# Patient Record
Sex: Female | Born: 1985 | Race: White | Hispanic: Yes | Smoking: Never smoker
Health system: Southern US, Community
[De-identification: ages and names within clinical notes are randomized; demographics above are authoritative.]

## PROBLEM LIST (undated history)

## (undated) DIAGNOSIS — R109 Unspecified abdominal pain: Secondary | ICD-10-CM

## (undated) DIAGNOSIS — Z789 Other specified health status: Secondary | ICD-10-CM

## (undated) DIAGNOSIS — O139 Gestational [pregnancy-induced] hypertension without significant proteinuria, unspecified trimester: Secondary | ICD-10-CM

## (undated) DIAGNOSIS — O1490 Unspecified pre-eclampsia, unspecified trimester: Secondary | ICD-10-CM

## (undated) HISTORY — DX: Gestational (pregnancy-induced) hypertension without significant proteinuria, unspecified trimester: O13.9

## (undated) HISTORY — DX: Other specified health status: Z78.9

## (undated) HISTORY — PX: OTHER SURGICAL HISTORY: SHX169

## (undated) HISTORY — PX: NO PAST SURGERIES: SHX2092

## (undated) HISTORY — DX: Unspecified pre-eclampsia, unspecified trimester: O14.90

---

## 2005-06-17 DIAGNOSIS — O1490 Unspecified pre-eclampsia, unspecified trimester: Secondary | ICD-10-CM

## 2005-06-17 HISTORY — DX: Unspecified pre-eclampsia, unspecified trimester: O14.90

## 2012-01-27 ENCOUNTER — Encounter: Payer: Self-pay | Admitting: Obstetrics & Gynecology

## 2012-01-27 ENCOUNTER — Ambulatory Visit (INDEPENDENT_AMBULATORY_CARE_PROVIDER_SITE_OTHER): Payer: Self-pay

## 2012-01-27 VITALS — BP 142/92 | Temp 97.8°F | Ht <= 58 in | Wt 151.5 lb

## 2012-01-27 DIAGNOSIS — Z3490 Encounter for supervision of normal pregnancy, unspecified, unspecified trimester: Secondary | ICD-10-CM

## 2012-01-27 DIAGNOSIS — Z3201 Encounter for pregnancy test, result positive: Secondary | ICD-10-CM

## 2012-01-27 DIAGNOSIS — O0990 Supervision of high risk pregnancy, unspecified, unspecified trimester: Secondary | ICD-10-CM

## 2012-01-27 DIAGNOSIS — O09299 Supervision of pregnancy with other poor reproductive or obstetric history, unspecified trimester: Secondary | ICD-10-CM

## 2012-01-27 DIAGNOSIS — R030 Elevated blood-pressure reading, without diagnosis of hypertension: Secondary | ICD-10-CM

## 2012-01-27 DIAGNOSIS — O09899 Supervision of other high risk pregnancies, unspecified trimester: Secondary | ICD-10-CM

## 2012-01-27 LAB — COMPREHENSIVE METABOLIC PANEL
Albumin: 4.1 g/dL (ref 3.5–5.2)
Alkaline Phosphatase: 72 U/L (ref 39–117)
BUN: 7 mg/dL (ref 6–23)
Calcium: 9.1 mg/dL (ref 8.4–10.5)
Chloride: 102 mEq/L (ref 96–112)
Glucose, Bld: 132 mg/dL — ABNORMAL HIGH (ref 70–99)
Potassium: 3.9 mEq/L (ref 3.5–5.3)

## 2012-01-27 LAB — POCT URINALYSIS DIP (DEVICE)
Bilirubin Urine: NEGATIVE
Glucose, UA: NEGATIVE mg/dL
Hgb urine dipstick: NEGATIVE
Leukocytes, UA: NEGATIVE
Nitrite: NEGATIVE
Urobilinogen, UA: 0.2 mg/dL (ref 0.0–1.0)

## 2012-01-27 LAB — POCT PREGNANCY, URINE: Preg Test, Ur: POSITIVE — AB

## 2012-01-27 LAB — HIV ANTIBODY (ROUTINE TESTING W REFLEX): HIV: NONREACTIVE

## 2012-01-27 NOTE — Progress Notes (Signed)
Pulse- 72  Pain/pressure- lower abd "ligament pain" Education booklet given.  Currently has WIC with previous child.

## 2012-01-28 ENCOUNTER — Ambulatory Visit (HOSPITAL_COMMUNITY)
Admission: RE | Admit: 2012-01-28 | Discharge: 2012-01-28 | Disposition: A | Discharge status: Home / Self Care | Payer: Self-pay | Source: Ambulatory Visit | Attending: Obstetrics & Gynecology | Admitting: Obstetrics & Gynecology

## 2012-01-28 DIAGNOSIS — O3680X0 Pregnancy with inconclusive fetal viability, not applicable or unspecified: Secondary | ICD-10-CM | POA: Insufficient documentation

## 2012-01-28 DIAGNOSIS — Z3490 Encounter for supervision of normal pregnancy, unspecified, unspecified trimester: Secondary | ICD-10-CM

## 2012-01-28 DIAGNOSIS — O09899 Supervision of other high risk pregnancies, unspecified trimester: Secondary | ICD-10-CM

## 2012-01-28 DIAGNOSIS — O09299 Supervision of pregnancy with other poor reproductive or obstetric history, unspecified trimester: Secondary | ICD-10-CM | POA: Insufficient documentation

## 2012-01-28 DIAGNOSIS — O34219 Maternal care for unspecified type scar from previous cesarean delivery: Secondary | ICD-10-CM | POA: Insufficient documentation

## 2012-01-28 LAB — OBSTETRIC PANEL
Basophils Absolute: 0 10*3/uL (ref 0.0–0.1)
Eosinophils Relative: 1 % (ref 0–5)
Hepatitis B Surface Ag: NEGATIVE
Lymphocytes Relative: 24 % (ref 12–46)
Neutro Abs: 6.4 10*3/uL (ref 1.7–7.7)
Neutrophils Relative %: 71 % (ref 43–77)
Platelets: 355 10*3/uL (ref 150–400)
RBC: 4.54 MIL/uL (ref 3.87–5.11)
RDW: 13.2 % (ref 11.5–15.5)
Rubella: 151.7 IU/mL
WBC: 9.1 10*3/uL (ref 4.0–10.5)

## 2012-01-29 LAB — CULTURE, OB URINE

## 2012-01-29 LAB — CREATININE CLEARANCE, URINE, 24 HOUR
Creatinine, 24H Ur: 255 mg/d — ABNORMAL LOW (ref 700–1800)
Creatinine, Urine: 19.6 mg/dL
Creatinine: 0.52 mg/dL (ref 0.50–1.10)

## 2012-01-29 LAB — HEMOGLOBINOPATHY EVALUATION
Hgb A: 97.4 % (ref 96.8–97.8)
Hgb F Quant: 0 % (ref 0.0–2.0)

## 2012-01-29 LAB — PROTEIN, URINE, 24 HOUR

## 2012-01-30 ENCOUNTER — Ambulatory Visit (INDEPENDENT_AMBULATORY_CARE_PROVIDER_SITE_OTHER): Payer: Self-pay | Admitting: Obstetrics and Gynecology

## 2012-01-30 ENCOUNTER — Other Ambulatory Visit: Payer: Self-pay

## 2012-01-30 ENCOUNTER — Encounter: Payer: Self-pay | Admitting: Obstetrics and Gynecology

## 2012-01-30 VITALS — Ht 58.75 in | Wt 152.3 lb

## 2012-01-30 VITALS — BP 118/76 | Temp 96.4°F | Wt 152.3 lb

## 2012-01-30 DIAGNOSIS — O34219 Maternal care for unspecified type scar from previous cesarean delivery: Secondary | ICD-10-CM

## 2012-01-30 DIAGNOSIS — Z3480 Encounter for supervision of other normal pregnancy, unspecified trimester: Secondary | ICD-10-CM

## 2012-01-30 DIAGNOSIS — Z98891 History of uterine scar from previous surgery: Secondary | ICD-10-CM | POA: Insufficient documentation

## 2012-01-30 DIAGNOSIS — O09299 Supervision of pregnancy with other poor reproductive or obstetric history, unspecified trimester: Secondary | ICD-10-CM | POA: Insufficient documentation

## 2012-01-30 DIAGNOSIS — Z3490 Encounter for supervision of normal pregnancy, unspecified, unspecified trimester: Secondary | ICD-10-CM

## 2012-01-30 LAB — POCT URINALYSIS DIP (DEVICE)
Bilirubin Urine: NEGATIVE
Glucose, UA: NEGATIVE mg/dL
Hgb urine dipstick: NEGATIVE
Ketones, ur: NEGATIVE mg/dL
Specific Gravity, Urine: 1.015 (ref 1.005–1.030)
pH: 6.5 (ref 5.0–8.0)

## 2012-01-30 MED ORDER — PRENATAL VITAMINS 0.8 MG PO TABS: 1.0000 | ORAL_TABLET | Freq: Every day | ORAL | Status: DC

## 2012-01-30 NOTE — Progress Notes (Signed)
   Subjective:    Penny Spencer is a [id number] [redacted]w[redacted]d being seen today for her first obstetrical visit.  Her obstetrical history is significant for pre-eclampsia and two previous cesarean sections. Patient reports preeclampsia with first pregnancy at term and was delivered by cesarean-section. Patient with uncomplicated second pregnancy and had an elective repeat cesarean section. Patient does intend to breast feed. Pregnancy history fully reviewed.  Patient reports lower abdominal cramping pains on occasions.  Filed Vitals:   01/30/12 1115  BP: 118/76  Temp: 96.4 F (35.8 C)  Weight: 152 lb 4.8 oz (69.083 kg)    HISTORY: OB History    Grav Para Term Preterm Abortions TAB SAB Ect Mult Living   4 2 2  1  1   2      # Outc Date GA Lbr Len/2nd Wgt Sex Del Anes PTL Lv   1 TRM 2/07 [redacted]w[redacted]d  6lb9.8oz(3kg) F LTCS Spinal  Yes   Comments: preeclampsia- emer   2 SAB 5/11           3 TRM 5/12 [redacted]w[redacted]d  8lb(3.629kg) F CS Spinal  Yes   4 CUR              Past Medical History  Diagnosis Date  . Pregnancy induced hypertension   . Preeclampsia 2007  . No pertinent past medical history    Past Surgical History  Procedure Date  . Cesarean section   . No past surgeries    Family History  Problem Relation Age of Onset  . Diabetes Father      Exam    Uterus:     Pelvic Exam:    Perineum: No Hemorrhoids, Normal Perineum   Vulva: normal   Vagina:  normal mucosa, normal discharge   pH:    Cervix: close and long   Adnexa: normal adnexa and no mass, fullness, tenderness   Bony Pelvis: android  System: Breast:  normal appearance, no masses or tenderness   Skin: normal coloration and turgor, no rashes    Neurologic: grossly non-focal   Extremities: normal strength, tone, and muscle mass   HEENT extra ocular movement intact   Mouth/Teeth mucous membranes moist, pharynx normal without lesions and dental hygiene good   Neck supple and no masses   Cardiovascular: regular rate and rhythm     Respiratory:  chest clear, no wheezing, crepitations, rhonchi, normal symmetric air entry   Abdomen: soft, non-tender; bowel sounds normal; no masses,  no organomegaly   Urinary:       Assessment:    Pregnancy: [id number] Patient Active Problem List  Diagnosis  . Previous cesarean section  . Hx of preeclampsia, prior pregnancy, currently pregnant  . Supervision of other normal pregnancy        Plan:     Initial labs drawn. Prenatal vitamins. Problem list reviewed and updated. Pap smears today Genetic Screening discussed First Screen: declined. Patient also declined quad screen  Ultrasound discussed; fetal survey: requested. Will be ordered at next visit  Follow up in 4 weeks. 30 min visit spent on counseling and coordination of care.     Daquon Greenleaf 01/30/2012

## 2012-01-30 NOTE — Progress Notes (Signed)
Nutrition Note: (1st visit consult) Pt has hx of obesity. Pt has gained 0.3# @[redacted]w[redacted]d , which is slightly < expected.  Pt reports eating 2 meals currently due to N/V. Pt reports only drinking water currently & is not taking PNV yet. Disc wt gain goals of 11-20#. Pt given verbal & written education on tips to decrease N/V & energy dense snacks to help with wt gain. Pt agrees to try to eat more often (suggested 3 meals & 1-2 snacks) & start taking PNV as prescribed by doctor. Pt does receive WIC services. Pt plans to BF. F/u if referred. Blondell Reveal, MS, RD, LDN

## 2012-01-30 NOTE — Progress Notes (Signed)
P= 67 Pressure in lower abdomen especially in supine position

## 2012-01-31 ENCOUNTER — Encounter: Payer: Self-pay | Admitting: Obstetrics and Gynecology

## 2012-02-01 LAB — CULTURE, OB URINE

## 2012-02-03 ENCOUNTER — Encounter: Payer: Self-pay | Admitting: Family Medicine

## 2012-02-26 ENCOUNTER — Ambulatory Visit (INDEPENDENT_AMBULATORY_CARE_PROVIDER_SITE_OTHER): Payer: Self-pay | Admitting: Family Medicine

## 2012-02-26 VITALS — BP 128/80 | Temp 97.0°F | Wt 155.3 lb

## 2012-02-26 DIAGNOSIS — O9989 Other specified diseases and conditions complicating pregnancy, childbirth and the puerperium: Secondary | ICD-10-CM

## 2012-02-26 DIAGNOSIS — O34219 Maternal care for unspecified type scar from previous cesarean delivery: Secondary | ICD-10-CM

## 2012-02-26 DIAGNOSIS — O09299 Supervision of pregnancy with other poor reproductive or obstetric history, unspecified trimester: Secondary | ICD-10-CM

## 2012-02-26 DIAGNOSIS — R51 Headache: Secondary | ICD-10-CM

## 2012-02-26 DIAGNOSIS — O99619 Diseases of the digestive system complicating pregnancy, unspecified trimester: Secondary | ICD-10-CM

## 2012-02-26 DIAGNOSIS — O26899 Other specified pregnancy related conditions, unspecified trimester: Secondary | ICD-10-CM

## 2012-02-26 DIAGNOSIS — K219 Gastro-esophageal reflux disease without esophagitis: Secondary | ICD-10-CM

## 2012-02-26 LAB — POCT URINALYSIS DIP (DEVICE)
Bilirubin Urine: NEGATIVE
Glucose, UA: NEGATIVE mg/dL
Ketones, ur: NEGATIVE mg/dL
Leukocytes, UA: NEGATIVE

## 2012-02-26 MED ORDER — RANITIDINE HCL 150 MG PO TABS: 150.0000 mg | ORAL_TABLET | Freq: Every day | ORAL | Status: DC

## 2012-02-26 NOTE — Progress Notes (Signed)
Pulse 78 Patient reports some abdominal pain. Also states that she feels the PNV are causing her to be very nauseous and also reports headaches and dizziness and a general feeling of "just feeling bad all time, like the flu"

## 2012-02-26 NOTE — Progress Notes (Signed)
Pt with occasional nausea/vomiting with prenatal vitamins, occasional reflux symptoms. Headaches/dizziness now and then. Symptoms new since around the time she found out she was pregnant. Advised Tylenol and Zantac, Flintstones vitamins if continues to tolerate PNV poorly. Follow pressures (142 systolic at 10w, 161 today, history of pre-E with first pregnancy).

## 2012-02-26 NOTE — Patient Instructions (Addendum)
Puede tomar Tylenol hasta 4 gramas (4000 mg) diario para dolor de cabeza.   Dolor de cabeza por tensin (Cefalea por contraccin muscular) (Tension Headache [Muscle Contraction Headache]) Le han diagnosticado una cefalea tensional. Se trata de una de las causas ms frecuentes de dolor de [place]. Generalmente estas cefaleas se sienten como un dolor en la parte superior de la cabeza y en la parte posterior del cuello. El estrs, la ansiedad y la depresin son disparadores frecuentes de Photographer. Las cefaleas por tensin no ponen en peligro la vida y no conducirn a otros tipos de cefalea. Este trastorno puede diagnosticarse a Glass blower/designer de sus antecedentes y de un examen fsico. Algunas veces es necesario realizar anlisis de laboratorio y radiografas para Pharmacist, hospital diagnstico (determinar cul es el problema). El profesional que lo asiste podr ayudarlo a que usted [employer] problemas personales que le ocasionan el estrs o la ansiedad. Podrn prescribirle antidepresivos si el problema es la depresin. INSTRUCCIONES PARA EL CUIDADO DOMICILIARIO  Si le realizan pruebas, comunquese para [employer]. Recuerde: es responsabilidad suya retirar los [employer] de [address]. No piense que est todo bien porque el profesional que lo asiste no lo llama.   Utilice los medicamentos de venta libre o de prescripcin para Chief Technology Officer, Environmental health practitioner o la [place], segn se lo indique el profesional que lo asiste.   Las tcnicas de biorretroalimentacin, los masajes y otras tcnicas de Microbiologist pueden ser de [place].   Pueden utilizarse bolsas de hielo o calor en la cabeza o en el cuello. Repita tres o cuatro veces por da, o cuando lo necesite.   Adems del [place], puede ser de utilidad la fisioterapia.   Si las cefaleas continan an con Scientist, research (medical), puede ser necesario que comience a pensar en realizar algunos cambios en su estilo de vida.  SOLICITE ATENCIN MDICA SI:  Presenta  algn problema con el medicamento que le han prescrito.   No responde o no obtiene S[employer].   El dolor de cabeza que senta habitualmente es diferente.   Si tiene nuseas (ganas de vomitar) o vmitos.  SOLICITE ATENCIN MDICA DE INMEDIATO SI:   El dolor de cabeza es cada vez ms intenso.   La temperatura oral se eleva sin motivo por encima de 102 F (38.9 C) o la que el profesional que lo asiste le indique.   Presenta rigidez en el cuello.   Sufre prdida de la visin.   Siente debilidad muscular.   Sufre prdida del control muscular.   Desarrolla sntomas graves diferentes de los primeros.   Comienza a perder el equilibrio o tiene problemas para caminar.   Se marea o pierde el conocimiento.  EST SEGURO QUE:   Comprende las instrucciones para el alta mdica.   Controlar su enfermedad.   Solicitar atencin mdica de inmediato segn las indicaciones.  Document Released: 03/13/2005 Document Revised: 05/23/2011 [facility] Patient Information 2012 [place], [place].  Acidez de estmago durante el embarazo  (Heartburn During Pregnancy)  La acidez es la sensacin de ardor en el pecho que se siente cuando el cido del estmago vuelve haca el esfago. La acidez (tambin llamada "reflujo") es frecuente en el embarazo debido a ciertos cambios hormonales (progesterona). La progesterona relaja la vlvula que separa el esfago del [place]. Esto hace que el cido suba al esfago y cause acidez. Tambin puede ocurrir Visual merchandiser debido a que el tero al agrandarse empuja el estmago, lo que hace que suba ms cido al  esfago. Esto se produce especialmente en las ltimas etapas del embarazo. La acidez generalmente desaparece despus del parto.  CAUSAS  La hormona progesterona.   Cambios en los niveles hormonales.   El tero crece y [address] cido del estmago [place].   Comidas abundantes.   Ciertos alimentos y bebidas.   La prctica de ejercicios.    Aumento en la produccin de cido.  SNTOMAS  Dolor intenso en el pecho o la parte baja de la garganta.   Gusto amargo en la boca.   Tos.  DIAGNSTICO El mdico la diagnostica con una historia clnica cuidadosa en la que pregunta por sus molestias. Le indicar un anlisis de sangre para Clinical research associate cierto tipo de bacteria que se asocia con la [place]. En algunos casos se diagnostica recetando un medicamento para calmar la acidez y viendo si los sntomas mejoran. Durante el embarazo no es frecuente que se indique una endoscopa. En este procedimiento se [place] un tubo con Neomia Dear luz y una cmara en un extremo, y se examina el esfago y Investment banker, corporate.  TRATAMIENTO  El mdico aconsejar sobre el uso de medicamentos de venta libre (anticidos, medicamentos para disminuir la Engineering geologist) en los casos de sntomas leves.   El mdico indicar medicamentos para disminuir el cido estomacal o para proteger la superficie del [place].   El profesional indicar cambios en la dieta.   En casos graves, el mdico recomendar que eleve la cabecera de la cama con bloques. (Dormir con ms almohadas no es Manufacturing systems engineer, ya que slo modifica la posicin de la cabeza y no mejora el problema principal del reflujo cido del estmago al esfago.)  INSTRUCCIONES PARA EL CUIDADO DOMICILIARIO  Tome todos los medicamentos segn le indic su mdico.   Levante la cabecera de la cama con bloques bajo las patas.   No haga ejercicios enseguida despus de comer.   Evite comer 2  3 horas antes de ir a dormir.  No se acueste enseguida despus de comer.   Haga comidas pequeas durante Glass blower/designer de 3 comidas abundantes.   Identifique los alimentos o las bebidas que empeoran sus sntomas y evtelos. Los alimentos que debe evitar son:   Pimientos.   Chocolate.   Alimentos alto en grasas, incluidos los fritos.   Comidas muy condimentadas.   Ajo, cebolla.   Frutos ctricos, que incluyen naranjas, uvas, limones  y limas.   Alimentos que contengan tomates o productos derivados del [place].   Menta.   Bebidas gaseosas y con cafena.   Vinagre.  SOLICITE ATENCIN MDICA DE INMEDIATO SI:  Tiene dolor intenso en el pecho que baja hacia el brazo o hacia la mandbula o cuello.   Se siente sudoroso, mareado o sufre un desmayo.   Falta de aire.   Vomita sangre.   Dolor o dificultad para tragar.   Materia fecal con sangre o de color negro.   Tiene acidez ms de 3 veces por semana durante ms de 2 semanas.  ASEGRESE DE QUE:   Comprende estas instrucciones.   Controlar su enfermedad.   Solicitar ayuda de inmediato si no mejora o si empeora.  Document Released: 03/13/2005 Document Revised: 05/23/2011 [facility] Patient Information 2012 [place], [place].  Embarazo - Segundo trimestre (Pregnancy - Second Trimester) El segundo trimestre del Psychiatrist (del 3 al ) es un perodo de evolucin rpida para usted y el beb. Hacia el final del sexto mes, el beb mide aproximadamente 23 cm y pesa 680 g. Comenzar a sentir  los movimientos del beb entre las 18 y las 20 [address] de [place]. Podr sentir las pataditas ("quickening en ingls"). Hay un rpido [employer]. Puede segregar un lquido claro Charity fundraiser) de las [place]. Quizs sienta pequeas contracciones en el vientre (tero) Esto se conoce como falso trabajo de parto o contracciones de [place]. Es como una prctica del trabajo de parto que se produce cuando el beb est listo para salir. Generalmente los problemas de vmitos matinales ya se han superado hacia el final del Medical sales representative. Algunas mujeres desarrollan pequeas manchas oscuras (que se denominan cloasma, mscara del embarazo) en la cara que normalmente se van luego del nacimiento del beb. La exposicin al sol empeora las manchas. Puede desarrollarse acn en algunas mujeres embarazadas, y puede desaparecer en aquellas que ya tienen acn. EXAMENES PRENATALES  Durante los  [employer], deber seguir realizando pruebas de [place], segn avance el [place]. Estas pruebas se realizan para controlar su salud y la del beb. Tambin se realizan anlisis de sangre para [employer] niveles de [place]. La anemia (bajo nivel de hemoglobina) es frecuente durante el embarazo. Para prevenirla, se administran hierro y vitaminas. Tambin se le realizarn exmenes para saber si tiene diabetes entre las 24 y las 28 semanas del [place]. Podrn repetirle algunas de las [employer] Enterprises hicieron previamente.   En cada visita le medirn el tamao del tero. Esto se realiza para asegurarse de que el beb est creciendo correctamente de acuerdo al estado del [place].   Tambin en cada visita prenatal controlarn su presin arterial. Esto se realiza para asegurarse de que no tenga toxemia.   Se controlar su orina para asegurarse de que no tenga infecciones, diabetes o protena en la orina.   Se controlar su peso regularmente para asegurarse que el aumento ocurre al ritmo indicado. Esto se hace para asegurarse que usted y el beb tienen una evolucin normal.   En algunas ocasiones se realiza una prueba de ultrasonido para confirmar el correcto desarrollo y evolucin del beb. Esta prueba se realiza con ondas sonoras inofensivas para el beb, de modo que el profesional pueda calcular ms precisamente la fecha del [place].  Algunas veces se realizan pruebas especializadas del lquido amnitico que rodea al beb. Esta prueba se denomina amniocentesis. El lquido amnitico se obtiene introduciendo una aguja en el vientre (abdomen). Se realiza para Conservator, museum/gallery en los que existe alguna preocupacin acerca de algn problema gentico que pueda sufrir el beb. En ocasiones se lleva a cabo cerca del final del embarazo, si es necesario inducir al Apple Computer. En este caso se realiza para asegurarse que los pulmones del beb estn lo suficientemente maduros como para que  pueda vivir fuera del tero. CAMBIOS QUE OCURREN EN EL SEGUNDO TRIMESTRE DEL EMBARAZO Su organismo atravesar numerosos cambios durante el [employer]. Estos pueden variar de Neomia Dear persona a otra. [place]verse con el profesional que la asiste acerca los cambios que usted note y que la preocupen.  Durante el segundo trimestre probablemente sienta un aumento del apetito. Es normal tener "antojos" de Development worker, community. Esto vara de Neomia Dear persona a otra y de un embarazo a Therapist, art.   El abdomen inferior comenzar a abultarse.   Podr tener la necesidad de Geographical information systems officer con ms frecuencia debido a que el tero y el beb presionan sobre la vejiga. Tambin es frecuente contraer ms infecciones urinarias durante el embarazo (dolor al ConocoPhillips). Puede evitarlas bebiendo gran cantidad de lquidos y vaciando la vejiga antes y despus de Pharmacologist  relaciones sexuales.   Podrn aparecer las primeras estras en las caderas, abdomen y [place]. Estos son cambios normales del cuerpo durante el [place]. No existen medicamentos ni ejercicios que puedan prevenir [employer].   Es posible que comience a desarrollar venas inflamadas y abultadas (varices) en las piernas. El uso de medias de descanso, Optometrist sus pies durante 15 minutos, 3 a 4 veces al da y Film/video editor la sal en su dieta ayuda a Journalist, newspaper.   Podr sentir Engineering geologist gstrica a medida que el tero crece y Doctor, general practice. Puede tomar anticidos, con la autorizacin de su mdico, para Financial planner. Tambin es til ingerir pequeas comidas 4 a 5 veces al Futures trader.   La constipacin puede tratarse con un laxante o agregando fibra a su dieta. Beber grandes cantidades de lquidos, comer vegetales, frutas y granos integrales es de [place].   Tambin es beneficioso practicar actividad fsica. Si ha sido una persona Engineer, mining, podr continuar con la Harley[employer]son de las actividades durante el mismo. Si ha sido [employer], puede ser beneficioso que comience  con un programa de ejercicios, Museum/gallery exhibitions officer.   Puede desarrollar hemorroides (vrices en el recto) hacia el final del segundo trimestre. Tomar baos de asiento tibios y Chemical engineer cremas recomendadas por el profesional que lo asiste sern de ayuda para los problemas de hemorroides.   Tambin podr Financial risk analyst de espalda durante este momento de su embarazo. Evite levantar objetos pesados, utilice zapatos de taco bajo y [place] buena postura para ayudar a reducir los problemas de [place].   Algunas mujeres embarazadas desarrollan hormigueo y adormecimiento de la mano y los dedos debido a la hinchazn y compresin de los ligamentos de la mueca (sndrome del tnel carpiano). Esto desaparece una vez que el beb nace.   Como sus pechos se agrandan, Pension scheme manager un sujetador ms grande. Use un sostn de soporte, cmodo y de algodn. No utilice un sostn para amamantar hasta el ltimo mes de embarazo si va a amamantar al beb.   Podr observar una lnea oscura desde el ombligo hacia la zona pbica denominada linea nigra.   Podr observar que sus mejillas se ponen coloradas debido al aumento de flujo sanguneo en la cara.   Podr desarrollar "araitas" en la cara, cuello y pecho. Esto desaparece una vez que el beb nace.  INSTRUCCIONES PARA EL CUIDADO DOMICILIARIO  Es extremadamente importante que evite el cigarrillo, hierbas medicinales, alcohol y las drogas no prescriptas durante el Psychiatrist. Estas sustancias qumicas afectan la formacin y el desarrollo del beb. Evite estas sustancias durante todo el embarazo para asegurar el nacimiento de un beb sano.   La mayor parte de los cuidados que se aconsejan son los mismos que los indicados para Financial risk analyst trimestre del Psychiatrist. Cumpla con las citas tal como se le indic. Siga las instrucciones del profesional que lo asiste con respecto al uso de los medicamentos, el ejercicio y Psychologist, forensic.   Durante el embarazo debe obtener nutrientes para  usted y para su beb. Consuma alimentos balanceados a intervalos regulares. Elija alimentos como carne, pescado, [place] y otros productos lcteos descremados, vegetales, frutas, panes integrales y cereales. El Equities trader cul es el aumento de peso ideal.   Las relaciones sexuales fsicas pueden continuarse hasta cerca del fin del embarazo si no existen otros problemas. Estos problemas pueden ser la prdida temprana (prematura) de lquido amnitico de las [place], sangrado vaginal, dolor abdominal u otros problemas mdicos o  del embarazo.   Realice Tesoro Corporation, si no tiene restricciones. Consulte con el profesional que la asiste si no sabe con certeza si determinados ejercicios son seguros. El mayor aumento de peso tiene Environmental consultant durante los ltimos 2 trimestres del Psychiatrist. El ejercicio la ayudar a:   Engineering geologist.   Ponerla en forma para el parto.   Ayudarla a perder peso luego de haber dado a luz.   Use un buen sostn o como los que se usan para hacer deportes para Paramedic la sensibilidad de las [place]. Tambin puede serle til si lo [place] mientras duerme. Si pierde Product manager, podr [employer]in.   No utilice la baera con agua caliente, baos turcos y saunas durante el [address]   Utilice el cinturn de seguridad sin excepcin cuando conduzca. Este la proteger a usted y al beb en caso de accidente.   Evite comer carne cruda, queso crudo, y el contacto con los utensilios y desperdicios de los gatos. Estos elementos contienen grmenes que pueden causar defectos de nacimiento en el beb.   El segundo trimestre es un buen momento para visitar a su dentista y Software engineer si an no lo ha hecho. Es Primary school teacher los dientes limpios. Utilice un cepillo de dientes blando. Cepllese ms suavemente durante el embarazo.   Es ms fcil perder algo de orina durante el [place]. Apretar y Chief Operating Officer los msculos de la pelvis la  ayudar con este problema. Practique detener la miccin cuando est en el bao. Estos son los mismos msculos que Development worker, international aid. Son [employer] mismos msculos que utiliza cuando trata de [employer] gases. Puede practicar apretando estos msculos 10 veces, y repetir esto 3 veces por da aproximadamente. Una vez que conozca qu msculos debe apretar, no realice estos ejercicios durante la miccin. Puede favorecerle una infeccin si la orina vuelve hacia atrs.   Pida ayuda si tiene necesidades econmicas, de asesoramiento o nutricionales durante el [place]. El profesional podr ayudarla con respecto a estas necesidades, o derivarla a otros especialistas.   La piel puede ponerse grasa. Si esto sucede, lvese la cara con un jabn [place], utilice un humectante no graso y [place] con base de aceite o crema.  CONSUMO DE MEDICAMENTOS Y DROGAS DURANTE EL EMBARAZO  Contine tomando las vitaminas apropiadas para esta etapa tal como se le indic. Las vitaminas deben contener un miligramo de cido flico y deben suplementarse con hierro. Guarde todas las vitaminas fuera del alcance de los nios. La ingestin de slo un par de vitaminas o tabletas que contengan hierro puede ocasionar la [employer] Mining en un beb o en un nio pequeo.   Evite el uso de [place], inclusive los de venta libre y hierbas que no hayan sido prescriptos o indicados por el profesional que la asiste. Algunos medicamentos pueden causar problemas fsicos al beb. Utilice los medicamentos de venta libre o de prescripcin para Chief Technology Officer, Environmental health practitioner o la [place], segn se lo indique el profesional que lo asiste. No utilice aspirina.   El consumo de alcohol est relacionado con ciertos defectos de nacimiento. Esto incluye el sndrome de alcoholismo fetal. Debe evitar el consumo de alcohol en cualquiera de sus formas. El cigarrillo causa nacimientos prematuros y bebs de [place]. El uso de drogas recreativas est absolutamente prohibido. Son  muy nocivas para el beb. Un beb que nace de [employer] Express, ser adicto al nacer. Ese beb tendr los mismos sntomas de abstinencia que un adulto.  Infrmele al profesional si consume alguna droga.   No consuma drogas ilegales. Pueden causarle mucho dao al beb.  SOLICITE ATENCIN MDICA SI: Tiene preguntas o preocupaciones durante su embarazo. Es mejor que llame para Science writer las dudas que esperar hasta su prxima visita prenatal. Thressa Sheller forma se sentir ms tranquila.  SOLICITE ATENCIN MDICA DE INMEDIATO SI:  La temperatura oral se eleva sin motivo por encima de 102 F (38.9 C) o segn le indique el profesional que lo asiste.   Tiene una prdida de lquido por la vagina (canal de parto). Si sospecha una ruptura de las [place], tmese la temperatura y llame al profesional para informarlo sobre esto.   Observa unas pequeas manchas, una hemorragia vaginal o elimina cogulos. Notifique al profesional acerca de la cantidad y de cuntos apsitos est utilizando. Unas pequeas manchas de sangre son algo comn durante el Psychiatrist, especialmente despus de Sales promotion account executive.   Presenta un olor desagradable en la secrecin vaginal y observa un cambio en el color, de transparente a blanco.   Contina con las nuseas y no obtiene alivio de los remedios indicados. Vomita sangre o algo similar a la borra del caf.   [place] o sube ms de 900 g. en una semana, o segn lo indicado por el profesional que la asiste.   Observa que se le Southwest Airlines, las manos, los pies o las piernas.   Ha estado expuesta a la rubola y no ha sufrido la enfermedad.   Ha estado expuesta a la quinta enfermedad o a la varicela.   Presenta dolor abdominal. Las molestias en el ligamento redondo son Neomia Dear causa no cancerosa (benigna) frecuente de dolor abdominal durante el embarazo. El profesional que la asiste deber evaluarla.   Presenta dolor de cabeza intenso que no se [place].   Presenta fiebre,  diarrea, dolor al orinar o le falta la respiracin.   Presenta dificultad para ver, visin borrosa, o visin doble.   Sufre una cada, un accidente de trnsito o cualquier tipo de trauma.   Vive en un hogar en el que existe violencia fsica o mental.  Document Released: 03/13/2005 Document Revised: 05/23/2011 [facility] Patient Information 2012 [place], [place].

## 2012-02-26 NOTE — Progress Notes (Signed)
I saw and examined pt and agree with note. Nausea/vomiting mostly in AM and likely due to reflux. Ranitidine at bedtime. Headache description c/w tension headache. Discussed ice/heat, stretches, tylenol use. BP of 142/92 first visit and 128/60 today. Hx preeclampsia first pregnancy but no HTN in between as far as pt knows. No history migraines. Early glucola negative. No proteinuria. Declined quad screen again. Sono in 4 to 5 weeks for fetal anatomy.

## 2012-03-25 ENCOUNTER — Ambulatory Visit (INDEPENDENT_AMBULATORY_CARE_PROVIDER_SITE_OTHER): Payer: Self-pay | Admitting: Advanced Practice Midwife

## 2012-03-25 VITALS — BP 119/76 | Temp 97.9°F | Wt 160.0 lb

## 2012-03-25 DIAGNOSIS — Z3480 Encounter for supervision of other normal pregnancy, unspecified trimester: Secondary | ICD-10-CM

## 2012-03-25 DIAGNOSIS — O34219 Maternal care for unspecified type scar from previous cesarean delivery: Secondary | ICD-10-CM

## 2012-03-25 LAB — POCT URINALYSIS DIP (DEVICE)
Bilirubin Urine: NEGATIVE
Glucose, UA: NEGATIVE mg/dL
Ketones, ur: NEGATIVE mg/dL
Specific Gravity, Urine: 1.02 (ref 1.005–1.030)
Urobilinogen, UA: 0.2 mg/dL (ref 0.0–1.0)

## 2012-03-25 NOTE — Progress Notes (Signed)
Pulse- 96 

## 2012-03-25 NOTE — Progress Notes (Signed)
Doing well.  Starting to feel some fetal movement, denies vaginal bleeding, LOF, cramping/contractions.  Reports daily mild h/a, relieved by Tylenol and some scotoma when h/a are worse, also resolved by Tylenol.  Neg protein, normal BP today.  Encouraged increase in PO fluids, take Tylenol at onset of h/a instead of when worsened.  Anatomy scan ordered.  Return in 4 weeks or call if h/a's worsen.

## 2012-03-27 ENCOUNTER — Ambulatory Visit (HOSPITAL_COMMUNITY)
Admission: RE | Admit: 2012-03-27 | Discharge: 2012-03-27 | Disposition: A | Discharge status: Home / Self Care | Payer: Self-pay | Source: Ambulatory Visit | Attending: Advanced Practice Midwife | Admitting: Advanced Practice Midwife

## 2012-03-27 DIAGNOSIS — Z1389 Encounter for screening for other disorder: Secondary | ICD-10-CM | POA: Insufficient documentation

## 2012-03-27 DIAGNOSIS — Z363 Encounter for antenatal screening for malformations: Secondary | ICD-10-CM | POA: Insufficient documentation

## 2012-03-27 DIAGNOSIS — O358XX0 Maternal care for other (suspected) fetal abnormality and damage, not applicable or unspecified: Secondary | ICD-10-CM | POA: Insufficient documentation

## 2012-03-27 DIAGNOSIS — Z3480 Encounter for supervision of other normal pregnancy, unspecified trimester: Secondary | ICD-10-CM

## 2012-04-22 ENCOUNTER — Encounter: Payer: Self-pay | Admitting: Family Medicine

## 2012-04-29 ENCOUNTER — Ambulatory Visit (INDEPENDENT_AMBULATORY_CARE_PROVIDER_SITE_OTHER): Payer: Self-pay | Admitting: Advanced Practice Midwife

## 2012-04-29 VITALS — BP 119/71 | Temp 97.3°F | Wt 164.7 lb

## 2012-04-29 DIAGNOSIS — Z3480 Encounter for supervision of other normal pregnancy, unspecified trimester: Secondary | ICD-10-CM

## 2012-04-29 LAB — POCT URINALYSIS DIP (DEVICE)
Bilirubin Urine: NEGATIVE
Nitrite: NEGATIVE
Protein, ur: 30 mg/dL — AB
pH: 6 (ref 5.0–8.0)

## 2012-04-29 NOTE — Progress Notes (Signed)
Suspect increased fundal ht is related to habitus. Discussed incisions from prior C/S .  No records from first in [place]. Had it done for preeclampsia. Has vertical skin scar. Second was in [facility]. No headache or visual changes. Will transfer to HR clinic. Glucola next visit. Wants GTL with Repeat C/S. Will investigate re: cost.

## 2012-04-29 NOTE — Patient Instructions (Addendum)
Embarazo - Segundo trimestre (Pregnancy - Second Trimester) El segundo trimestre del embarazo (del 3 al 6mes) es un perodo de evolucin rpida para usted y el beb. Hacia el final del sexto mes, el beb mide aproximadamente 23 cm y pesa 680 g. Comenzar a sentir los movimientos del beb entre las 18 y las 20 semanas de embarazo. Podr sentir las pataditas ("quickening en ingls"). Hay un rpido aumento de peso. Puede segregar un lquido claro (calostro) de las mamas. Quizs sienta pequeas contracciones en el vientre (tero) Esto se conoce como falso trabajo de parto o contracciones de Braxton-Hicks. Es como una prctica del trabajo de parto que se produce cuando el beb est listo para salir. Generalmente los problemas de vmitos matinales ya se han superado hacia el final del primer trimestre. Algunas mujeres desarrollan pequeas manchas oscuras (que se denominan cloasma, mscara del embarazo) en la cara que normalmente se van luego del nacimiento del beb. La exposicin al sol empeora las manchas. Puede desarrollarse acn en algunas mujeres embarazadas, y puede desaparecer en aquellas que ya tienen acn. EXAMENES PRENATALES  Durante los exmenes prenatales, deber seguir realizando pruebas de sangre, segn avance el embarazo. Estas pruebas se realizan para controlar su salud y la del beb. Tambin se realizan anlisis de sangre para conocer los niveles de hemoglobina. La anemia (bajo nivel de hemoglobina) es frecuente durante el embarazo. Para prevenirla, se administran hierro y vitaminas. Tambin se le realizarn exmenes para saber si tiene diabetes entre las 24 y las 28 semanas del embarazo. Podrn repetirle algunas de las pruebas que le hicieron previamente.  En cada visita le medirn el tamao del tero. Esto se realiza para asegurarse de que el beb est creciendo correctamente de acuerdo al estado del embarazo.  Tambin en cada visita prenatal controlarn su presin arterial. Esto se realiza  para asegurarse de que no tenga toxemia.  Se controlar su orina para asegurarse de que no tenga infecciones, diabetes o protena en la orina.  Se controlar su peso regularmente para asegurarse que el aumento ocurre al ritmo indicado. Esto se hace para asegurarse que usted y el beb tienen una evolucin normal.  En algunas ocasiones se realiza una prueba de ultrasonido para confirmar el correcto desarrollo y evolucin del beb. Esta prueba se realiza con ondas sonoras inofensivas para el beb, de modo que el profesional pueda calcular ms precisamente la fecha del parto. Algunas veces se realizan pruebas especializadas del lquido amnitico que rodea al beb. Esta prueba se denomina amniocentesis. El lquido amnitico se obtiene introduciendo una aguja en el vientre (abdomen). Se realiza para controlar los cromosomas en aquellos casos en los que existe alguna preocupacin acerca de algn problema gentico que pueda sufrir el beb. En ocasiones se lleva a cabo cerca del final del embarazo, si es necesario inducir al parto. En este caso se realiza para asegurarse que los pulmones del beb estn lo suficientemente maduros como para que pueda vivir fuera del tero. CAMBIOS QUE OCURREN EN EL SEGUNDO TRIMESTRE DEL EMBARAZO Su organismo atravesar numerosos cambios durante el embarazo. Estos pueden variar de una persona a otra. Converse con el profesional que la asiste acerca los cambios que usted note y que la preocupen.  Durante el segundo trimestre probablemente sienta un aumento del apetito. Es normal tener "antojos" de ciertas comidas. Esto vara de una persona a otra y de un embarazo a otro.  El abdomen inferior comenzar a abultarse.  Podr tener la necesidad de orinar con ms frecuencia debido a que   el tero y el beb presionan sobre la vejiga. Tambin es frecuente contraer ms infecciones urinarias durante el embarazo (dolor al orinar). Puede evitarlas bebiendo gran cantidad de lquidos y vaciando  la vejiga antes y despus de mantener relaciones sexuales.  Podrn aparecer las primeras estras en las caderas, abdomen y mamas. Estos son cambios normales del cuerpo durante el embarazo. No existen medicamentos ni ejercicios que puedan prevenir estos cambios.  Es posible que comience a desarrollar venas inflamadas y abultadas (varices) en las piernas. El uso de medias de descanso, elevar sus pies durante 15 minutos, 3 a 4 veces al da y limitar la sal en su dieta ayuda a aliviar el problema.  Podr sentir acidez gstrica a medida que el tero crece y presiona contra el estmago. Puede tomar anticidos, con la autorizacin de su mdico, para aliviar este problema. Tambin es til ingerir pequeas comidas 4 a 5 veces al da.  La constipacin puede tratarse con un laxante o agregando fibra a su dieta. Beber grandes cantidades de lquidos, comer vegetales, frutas y granos integrales es de gran ayuda.  Tambin es beneficioso practicar actividad fsica. Si ha sido una persona activa hasta el embarazo, podr continuar con la mayora de las actividades durante el mismo. Si ha sido menos activa, puede ser beneficioso que comience con un programa de ejercicios, como realizar caminatas.  Puede desarrollar hemorroides (vrices en el recto) hacia el final del segundo trimestre. Tomar baos de asiento tibios y utilizar cremas recomendadas por el profesional que lo asiste sern de ayuda para los problemas de hemorroides.  Tambin podr sentir dolor de espalda durante este momento de su embarazo. Evite levantar objetos pesados, utilice zapatos de taco bajo y mantenga una buena postura para ayudar a reducir los problemas de espalda.  Algunas mujeres embarazadas desarrollan hormigueo y adormecimiento de la mano y los dedos debido a la hinchazn y compresin de los ligamentos de la mueca (sndrome del tnel carpiano). Esto desaparece una vez que el beb nace.  Como sus pechos se agrandan, necesitar un sujetador  ms grande. Use un sostn de soporte, cmodo y de algodn. No utilice un sostn para amamantar hasta el ltimo mes de embarazo si va a amamantar al beb.  Podr observar una lnea oscura desde el ombligo hacia la zona pbica denominada linea nigra.  Podr observar que sus mejillas se ponen coloradas debido al aumento de flujo sanguneo en la cara.  Podr desarrollar "araitas" en la cara, cuello y pecho. Esto desaparece una vez que el beb nace. INSTRUCCIONES PARA EL CUIDADO DOMICILIARIO  Es extremadamente importante que evite el cigarrillo, hierbas medicinales, alcohol y las drogas no prescriptas durante el embarazo. Estas sustancias qumicas afectan la formacin y el desarrollo del beb. Evite estas sustancias durante todo el embarazo para asegurar el nacimiento de un beb sano.  La mayor parte de los cuidados que se aconsejan son los mismos que los indicados para el primer trimestre del embarazo. Cumpla con las citas tal como se le indic. Siga las instrucciones del profesional que lo asiste con respecto al uso de los medicamentos, el ejercicio y la dieta.  Durante el embarazo debe obtener nutrientes para usted y para su beb. Consuma alimentos balanceados a intervalos regulares. Elija alimentos como carne, pescado, leche y otros productos lcteos descremados, vegetales, frutas, panes integrales y cereales. El profesional le informar cul es el aumento de peso ideal.  Las relaciones sexuales fsicas pueden continuarse hasta cerca del fin del embarazo si no existen otros problemas. Estos   problemas pueden ser la prdida temprana (prematura) de lquido amnitico de las membranas, sangrado vaginal, dolor abdominal u otros problemas mdicos o del embarazo.  Realice actividad fsica todos los das, si no tiene restricciones. Consulte con el profesional que la asiste si no sabe con certeza si determinados ejercicios son seguros. El mayor aumento de peso tiene lugar durante los ltimos 2 trimestres del  embarazo. El ejercicio la ayudar a:  Controlar su peso.  Ponerla en forma para el parto.  Ayudarla a perder peso luego de haber dado a luz.  Use un buen sostn o como los que se usan para hacer deportes para aliviar la sensibilidad de las mamas. Tambin puede serle til si lo usa mientras duerme. Si pierde calostro, podr utilizar apsitos en el sostn.  No utilice la baera con agua caliente, baos turcos y saunas durante el embarazo.  Utilice el cinturn de seguridad sin excepcin cuando conduzca. Este la proteger a usted y al beb en caso de accidente.  Evite comer carne cruda, queso crudo, y el contacto con los utensilios y desperdicios de los gatos. Estos elementos contienen grmenes que pueden causar defectos de nacimiento en el beb.  El segundo trimestre es un buen momento para visitar a su dentista y evaluar su salud dental si an no lo ha hecho. Es importante mantener los dientes limpios. Utilice un cepillo de dientes blando. Cepllese ms suavemente durante el embarazo.  Es ms fcil perder algo de orina durante el embarazo. Apretar y fortalecer los msculos de la pelvis la ayudar con este problema. Practique detener la miccin cuando est en el bao. Estos son los mismos msculos que necesita fortalecer. Son tambin los mismos msculos que utiliza cuando trata de evitar los gases. Puede practicar apretando estos msculos 10 veces, y repetir esto 3 veces por da aproximadamente. Una vez que conozca qu msculos debe apretar, no realice estos ejercicios durante la miccin. Puede favorecerle una infeccin si la orina vuelve hacia atrs.  Pida ayuda si tiene necesidades econmicas, de asesoramiento o nutricionales durante el embarazo. El profesional podr ayudarla con respecto a estas necesidades, o derivarla a otros especialistas.  La piel puede ponerse grasa. Si esto sucede, lvese la cara con un jabn suave, utilice un humectante no graso y maquillaje con base de aceite o  crema. CONSUMO DE MEDICAMENTOS Y DROGAS DURANTE EL EMBARAZO  Contine tomando las vitaminas apropiadas para esta etapa tal como se le indic. Las vitaminas deben contener un miligramo de cido flico y deben suplementarse con hierro. Guarde todas las vitaminas fuera del alcance de los nios. La ingestin de slo un par de vitaminas o tabletas que contengan hierro puede ocasionar la muerte en un beb o en un nio pequeo.  Evite el uso de medicamentos, inclusive los de venta libre y hierbas que no hayan sido prescriptos o indicados por el profesional que la asiste. Algunos medicamentos pueden causar problemas fsicos al beb. Utilice los medicamentos de venta libre o de prescripcin para el dolor, el malestar o la fiebre, segn se lo indique el profesional que lo asiste. No utilice aspirina.  El consumo de alcohol est relacionado con ciertos defectos de nacimiento. Esto incluye el sndrome de alcoholismo fetal. Debe evitar el consumo de alcohol en cualquiera de sus formas. El cigarrillo causa nacimientos prematuros y bebs de bajo peso. El uso de drogas recreativas est absolutamente prohibido. Son muy nocivas para el beb. Un beb que nace de una madre adicta, ser adicto al nacer. Ese beb tendr los mismos   sntomas de abstinencia que un adulto.  Infrmele al profesional si consume alguna droga.  No consuma drogas ilegales. Pueden causarle mucho dao al beb. SOLICITE ATENCIN MDICA SI: Tiene preguntas o preocupaciones durante su embarazo. Es mejor que llame para consultar las dudas que esperar hasta su prxima visita prenatal. De esta forma se sentir ms tranquila.  SOLICITE ATENCIN MDICA DE INMEDIATO SI:  La temperatura oral se eleva sin motivo por encima de 102 F (38.9 C) o segn le indique el profesional que lo asiste.  Tiene una prdida de lquido por la vagina (canal de parto). Si sospecha una ruptura de las membranas, tmese la temperatura y llame al profesional para informarlo sobre  esto.  Observa unas pequeas manchas, una hemorragia vaginal o elimina cogulos. Notifique al profesional acerca de la cantidad y de cuntos apsitos est utilizando. Unas pequeas manchas de sangre son algo comn durante el embarazo, especialmente despus de mantener relaciones sexuales.  Presenta un olor desagradable en la secrecin vaginal y observa un cambio en el color, de transparente a blanco.  Contina con las nuseas y no obtiene alivio de los remedios indicados. Vomita sangre o algo similar a la borra del caf.  [place] o sube ms de 900 g. en una semana, o segn lo indicado por el profesional que la asiste.  Observa que se le hinchan el rostro, las manos, los pies o las piernas.  Ha estado expuesta a la rubola y no ha sufrido la enfermedad.  Ha estado expuesta a la quinta enfermedad o a la varicela.  Presenta dolor abdominal. Las molestias en el ligamento redondo son una causa no cancerosa (benigna) frecuente de dolor abdominal durante el embarazo. El profesional que la asiste deber evaluarla.  Presenta dolor de cabeza intenso que no se alivia.  Presenta fiebre, diarrea, dolor al orinar o le falta la respiracin.  Presenta dificultad para ver, visin borrosa, o visin doble.  Sufre una cada, un accidente de trnsito o cualquier tipo de trauma.  Vive en un hogar en el que existe violencia fsica o mental. Document Released: 03/13/2005 Document Revised: 08/26/2011 [employer] Patient Information 2013 [employer].  

## 2012-04-29 NOTE — Progress Notes (Signed)
Pulse: 89

## 2012-05-18 ENCOUNTER — Encounter: Payer: Self-pay | Admitting: Obstetrics & Gynecology

## 2012-05-28 ENCOUNTER — Ambulatory Visit (INDEPENDENT_AMBULATORY_CARE_PROVIDER_SITE_OTHER): Payer: Self-pay | Admitting: Family

## 2012-05-28 VITALS — BP 115/76 | Temp 97.2°F | Wt 169.1 lb

## 2012-05-28 DIAGNOSIS — O2340 Unspecified infection of urinary tract in pregnancy, unspecified trimester: Secondary | ICD-10-CM

## 2012-05-28 DIAGNOSIS — O09299 Supervision of pregnancy with other poor reproductive or obstetric history, unspecified trimester: Secondary | ICD-10-CM

## 2012-05-28 DIAGNOSIS — IMO0002 Reserved for concepts with insufficient information to code with codable children: Secondary | ICD-10-CM

## 2012-05-28 DIAGNOSIS — N390 Urinary tract infection, site not specified: Secondary | ICD-10-CM

## 2012-05-28 DIAGNOSIS — O2390 Unspecified genitourinary tract infection in pregnancy, unspecified trimester: Secondary | ICD-10-CM

## 2012-05-28 DIAGNOSIS — Z3480 Encounter for supervision of other normal pregnancy, unspecified trimester: Secondary | ICD-10-CM

## 2012-05-28 LAB — POCT URINALYSIS DIP (DEVICE)
Glucose, UA: NEGATIVE mg/dL
Leukocytes, UA: NEGATIVE
Protein, ur: NEGATIVE mg/dL
Urobilinogen, UA: 0.2 mg/dL (ref 0.0–1.0)

## 2012-05-28 MED ORDER — CEPHALEXIN 500 MG PO CAPS: 500.0000 mg | ORAL_CAPSULE | Freq: Three times a day (TID) | ORAL | Status: DC

## 2012-05-28 MED ORDER — NITROFURANTOIN MONOHYD MACRO 100 MG PO CAPS: 100.0000 mg | ORAL_CAPSULE | Freq: Two times a day (BID) | ORAL | Status: DC

## 2012-05-28 NOTE — Progress Notes (Signed)
U/S scheduled 06/01/12 at 1130 am.

## 2012-05-28 NOTE — Addendum Note (Signed)
Addended by: Gerome Apley on: 05/28/2012 02:14 PM   Modules accepted: Orders

## 2012-05-28 NOTE — Progress Notes (Signed)
Reports increased vaginal pressure; no contractions or bleeding.  Cervix closed/thick.  +Nitrites in urine; RX Keflex 500 TID x 7 days; urine culture.  PTL precautions.  Pt reports cannot stay for 1 hr today, will come in during the upcoming week.  RBS 95, fundal height 32, ultrasound for growth.

## 2012-05-28 NOTE — Progress Notes (Signed)
P=91, C/o numbness in both legs when sitting a long time, but relieves when stands up . Feels pelvic pressure like something is going to come out- states has lasted 10 days, also c/o cramps in legs

## 2012-05-30 LAB — CULTURE, OB URINE: Colony Count: 50000

## 2012-06-01 ENCOUNTER — Ambulatory Visit (HOSPITAL_COMMUNITY)
Admission: RE | Admit: 2012-06-01 | Discharge: 2012-06-01 | Disposition: A | Discharge status: Home / Self Care | Payer: Self-pay | Source: Ambulatory Visit | Attending: Family | Admitting: Family

## 2012-06-01 ENCOUNTER — Telehealth: Payer: Self-pay | Admitting: *Deleted

## 2012-06-01 DIAGNOSIS — IMO0002 Reserved for concepts with insufficient information to code with codable children: Secondary | ICD-10-CM

## 2012-06-01 NOTE — Telephone Encounter (Addendum)
Called pt w/Penny Spencer and left message that we are calling to reschedule her [place] which she was not able to have today due to her work schedule. Please call back so that we may give her a new appt.  **Pt needs to be scheduled for this week or 12/23 @ the latest**   I observed from the appt schedule, that pt has been rescheduled for her [place] on 12/18 @ 1015. No further calls to pt are necessary @ this time.

## 2012-06-03 ENCOUNTER — Ambulatory Visit (HOSPITAL_COMMUNITY)
Admission: RE | Admit: 2012-06-03 | Discharge: 2012-06-03 | Disposition: A | Discharge status: Home / Self Care | Payer: Self-pay | Source: Ambulatory Visit | Attending: Family | Admitting: Family

## 2012-06-03 DIAGNOSIS — O3660X0 Maternal care for excessive fetal growth, unspecified trimester, not applicable or unspecified: Secondary | ICD-10-CM | POA: Insufficient documentation

## 2012-06-03 DIAGNOSIS — O34219 Maternal care for unspecified type scar from previous cesarean delivery: Secondary | ICD-10-CM | POA: Insufficient documentation

## 2012-06-03 DIAGNOSIS — O09299 Supervision of pregnancy with other poor reproductive or obstetric history, unspecified trimester: Secondary | ICD-10-CM | POA: Insufficient documentation

## 2012-06-11 ENCOUNTER — Ambulatory Visit (INDEPENDENT_AMBULATORY_CARE_PROVIDER_SITE_OTHER): Payer: Self-pay | Admitting: Obstetrics & Gynecology

## 2012-06-11 VITALS — BP 112/71 | Temp 97.2°F | Wt 171.2 lb

## 2012-06-11 DIAGNOSIS — Z3490 Encounter for supervision of normal pregnancy, unspecified, unspecified trimester: Secondary | ICD-10-CM

## 2012-06-11 DIAGNOSIS — Z3480 Encounter for supervision of other normal pregnancy, unspecified trimester: Secondary | ICD-10-CM

## 2012-06-11 LAB — CBC
Hemoglobin: 11 g/dL — ABNORMAL LOW (ref 12.0–15.0)
MCH: 28.4 pg (ref 26.0–34.0)
Platelets: 307 10*3/uL (ref 150–400)
RBC: 3.87 MIL/uL (ref 3.87–5.11)
WBC: 8.9 10*3/uL (ref 4.0–10.5)

## 2012-06-11 LAB — POCT URINALYSIS DIP (DEVICE)
Bilirubin Urine: NEGATIVE
Glucose, UA: NEGATIVE mg/dL
Ketones, ur: NEGATIVE mg/dL
Leukocytes, UA: NEGATIVE
Nitrite: NEGATIVE
pH: 6.5 (ref 5.0–8.0)

## 2012-06-11 NOTE — Progress Notes (Signed)
Pulse 91 Patient reports pelvic pain with cramping as well as numbness in legs.  Needs CBC, RPR, HIV, and 1 hr gtt @ 926

## 2012-06-11 NOTE — Addendum Note (Signed)
Addended by: Franchot Mimes on: 06/11/2012 02:01 PM   Modules accepted: Orders

## 2012-06-11 NOTE — Patient Instructions (Signed)
Lactancia materna  (Breastfeeding) Decidir amamantar es una de las mejores elecciones que puede hacer por usted y su beb. La informacin que se brinda a continuacin le dar una breve visin de los beneficios de la lactancia materna as como de las dudas ms frecuentes alrededor de ella.  LOS BENEFICIOS DE AMAMANTAR  Para el beb   La primera leche (calostro ) ayuda al mejor funcionamiento del sistema digestivo del beb.   La leche tiene anticuerpos que provienen de la madre y que ayudan a prevenir las infecciones en el beb.   El beb tiene una menor incidencia de asma, alergias y del sndrome de muerte sbita del lactante (SMSL).   Los nutrientes en la leche materna son mejores para el beb que los preparados para lactantes y la leche materna ayuda a un mejor desarrollo del cerebro del beb.   Los bebs amamantados tienen menos gases, clicos y estreimiento.  Para la mam   La lactancia materna favorece el desarrollo de un vnculo muy especial entre la madre y el beb.   Es ms conveniente, siempre disponible y a la temperatura adecuada y econmico.   Consume caloras en la madre y la ayuda a perder el peso ganado durante el embarazo.   Favorece la contraccin del tero a su tamao normal, de manera ms rpida y disminuye las hemorragias luego del parto.   Las madres que amamantan tienen menor riesgo de desarrollar cncer de mama.  FRECUENCIA DEL AMAMANTAMIENTO   Un beb sano, nacido a trmino, puede amamantarse con tanta frecuencia como cada hora, o espaciar las comidas cada tres horas.   Observe al beb cuando manifieste signos de hambre. Amamante a su beb si muestra signos de hambre. Esta frecuencia variar de un beb a otro.   Amamntelo tan seguido como el beb lo solicite, o cuando usted sienta la necesidad de aliviar sus mamas.   Despierte al beb si han pasado 3  4 horas desde la ltima comida.   El amamantamiento frecuente la ayudar a producir ms  leche y a prevenir problemas de dolor en los pezones e hinchazn de las mamas.  POSICIN DEL BEBE PARA EL AMAMANTAMIENTO   Ya sea que se encuentre acostada o sentada, asegrese que el abdomen del beb enfrente el suyo.   Sostenga la mama con el pulgar por arriba y los otros 4 dedos por debajo. Asegrese que sus dedos se encuentren lejos del pezn y de la boca del beb.   Empuje suavemente los labios del beb con el pezn o con el dedo.   Cuando la boca del beb se abra lo suficiente, introduzca el pezn y la areola tanto como le sea posible dentro de la boca.   Coloque al beb cerca suyo de modo que su nariz y mejillas toquen las mamas al mamar.  ALIMENTACIN Y SUCCIN   La duracin de cada comida vara de un beb a otro y de una comida a otra.   El beb debe succionar entre 2 y 3 minutos para que le llegue leche. Esto se denomina "bajada". Por este motivo, permita que el nio se alimente en cada mama todo lo que desee. Terminar de mamar cuando haya recibido la cantidad adecuada de nutrientes.   Para detener la succin coloque su dedo en la comisura de la boca del nio y deslcelo entre sus encas antes de quitarle la mama de la boca. Esto la ayudar a evitar el dolor en los pezones.  COMO SABER SI EL BEB OBTIENE LA   SUFICIENTE LECHE MATERNA  Preguntarse si el beb obtiene la cantidad suficiente de leche es una preocupacin frecuente entre las madres. Puede asegurarse que el beb tiene la leche suficiente si:   El beb succiona activamente y usted escucha que traga .   El beb parece estar relajado y satisfecho despus de mamar.   El nio se alimenta al menos 8 a 12 veces en 24 horas. Alimntelo hasta que se desprenda por sus propios medios o se quede dormido en la primera mama (al menos durante 10 a 20 minutos), luego ofrzcale el otro lado.   El beb moja 5 a 6 paales desechables (6 a 8 paales de tela) en 24 horas cuando tiene 5  6 das de vida.   Tiene al menos 3 a 4  deposiciones todos los das en los primeros meses. La materia fecal debe ser blanda y amarillenta.   El beb debe aumentar 4 a 6 libras (120 a 170 gr.) por semana despus de los 4 das de vida.   Siente que las mamas se ablandan despus de amamantar  REDUCIR LA CONGESTIN DE LAS MAMAS   Durante la primera semana despus del parto, usted puede experimentar hinchazn en las mamas. Cuando las mamas estn congestionadas, se sienten calientes, llenas y molestas al tacto. Puede reducir la congestin si:   Lo amamanta frecuentemente, cada 2-3 horas. Las mams que amamantan pronto y con frecuencia tienen menos problemas de congestin.   Coloque compresas de hielo en sus mamas durante 10-20 minutos entre cada amamantamiento. Esto ayuda a reducir la hinchazn. Envuelva las bolsas de hielo en una toalla liviana para proteger su piel. Las bolsas de vegetales congelados funcionan bien para este propsito.   Tome una ducha tibia o aplique compresas hmedas calientes en las mamas durante 5 a 10 minutos antes de cada vez que amamanta. Esto aumenta la circulacin y ayuda a que la leche fluya.   Masajee suavemente la mama antes y durante la alimentacin. Con las puntas de los dedos, masajee desde la pared torcica hacia abajo hasta llegar al pezn, con movimientos circulares.   Asegrese que el nio vaca al menos una mama antes de cambiar de lado.   Use un sacaleche para vaciar la mama si el beb se duerme o no se alimenta bien. Tambin podr quitarse la leche con esa bomba si tiene que volver al trabajo o siente que las mamas estn congestionadas.   Evite los biberones, chupetes o complementar la alimentacin con agua o jugos en lugar de la leche materna. La leche materna es todo el alimento que el beb necesita. No es necesario que el nio ingiera agua o preparados de bibern. De hecho, es lo mejor para ayudar a que las mamas produzcan ms leche. no darle suplementos al nio durante las primeras  semanas.   Verifique que el beb se encuentra en la posicin correcta mientras lo alimenta.   Use un sostn que soporte bien sus mamas y evite los que tienen aro.   Consuma una dieta balanceada y beba lquidos en cantidad.   Descanse con frecuencia, reljese y tome sus vitaminas prenatales para evitar la fatiga, el estrs y la anemia.  Si sigue estas indicaciones, la congestin debe mejorar en 24 a 48 horas. Si an tiene dificultades, consulte a su asesor en lactancia.  CUDESE USTED MISMA  Cuide sus mamas.   Bese o dchese diariamente.   Evite usar jabn en los pezones.   Comience a amamantar del lado izquierdo en una comida   y del lado derecho en la siguiente.   Notar que aumenta el flujo de leche a los 2 a 5 das despus del parto. Puede sentir algunas molestias por la congestin, lo que hace que sus mamas estn duras y sensibles. La congestin disminuye en 24 a 48 horas. Mientras tanto, aplique toallas hmedas calientes durante 5 a 10 minutos antes de amamantar. Un masaje suave y la extraccin de un poco de leche antes de amamantar ablandarn las mamas y har ms fcil que el beb se agarre.   Use un buen sostn y seque al aire los pezones durante 3 a 4 minutos luego de cada alimentacin.   Solo utilice apsitos de algodn.   Utilice lanolina pura sobre los pezones luego de amamantar. No necesita lavarlos luego de alimentar al nio. Otra opcin es exprimir algunas gotas de leche y masajear suavemente los pezones.  Cumpla con estos cuidados   Consuma alimentos bien balanceados y refrigerios nutritivos.   Beba leche, jugos de fruta y agua para satisfacer la sed (alrededor de 8 vasos por da).   Descanse lo suficiente.  Evite los alimentos que usted note que pueden afectar al beb.  SOLICITE ATENCIN MDICA SI:   Tiene dificultad con la lactancia materna y necesita ayuda.   Tiene una zona de color rojo, dura y dolorosa en la mama que se acompaa de fiebre.    El beb est muy somnoliento como para alimentarse bien o tiene problemas para dormir.   Su beb moja menos de 6 paales al da, a los 5 das de vida.   La piel del beb o la parte blanca de sus ojos est ms amarilla de lo que estaba en el hospital.   Se siente deprimida.  Document Released: 06/03/2005 Document Revised: 12/03/2011 [employer] Patient Information 2013 [employer].  

## 2012-06-11 NOTE — Progress Notes (Signed)
For BTL and rpt c/s.  Will schedule at 39 weeks.  [place] growth was 63% on 06/01/12.

## 2012-06-12 ENCOUNTER — Encounter: Payer: Self-pay | Admitting: Obstetrics & Gynecology

## 2012-06-12 ENCOUNTER — Telehealth: Payer: Self-pay

## 2012-06-12 LAB — HIV ANTIBODY (ROUTINE TESTING W REFLEX): HIV: NONREACTIVE

## 2012-06-12 LAB — GLUCOSE TOLERANCE, 1 HOUR (50G) W/O FASTING: Glucose, 1 Hour GTT: 139 mg/dL (ref 70–140)

## 2012-06-12 LAB — RPR

## 2012-06-12 NOTE — Telephone Encounter (Signed)
Called pt with Penny [facility] and informed her of abnormal 1hr gtt and if she could come for a 3 hr.  Pt stated that she would be able to come in on Monday @ 8am.

## 2012-06-12 NOTE — Telephone Encounter (Signed)
Message copied by Faythe Casa on Fri Jun 12, 2012 11:20 AM ------      Message from: Lesly Dukes      Created: Fri Jun 12, 2012 10:02 AM       Needs 3 hr gtt

## 2012-06-15 ENCOUNTER — Other Ambulatory Visit (INDEPENDENT_AMBULATORY_CARE_PROVIDER_SITE_OTHER): Payer: Self-pay

## 2012-06-15 DIAGNOSIS — O34219 Maternal care for unspecified type scar from previous cesarean delivery: Secondary | ICD-10-CM

## 2012-06-15 DIAGNOSIS — R7309 Other abnormal glucose: Secondary | ICD-10-CM

## 2012-06-15 LAB — POCT URINALYSIS DIP (DEVICE)
Bilirubin Urine: NEGATIVE
Glucose, UA: NEGATIVE mg/dL
Ketones, ur: NEGATIVE mg/dL
Leukocytes, UA: NEGATIVE
Nitrite: NEGATIVE
pH: 6.5 (ref 5.0–8.0)

## 2012-06-16 LAB — GLUCOSE TOLERANCE, 3 HOURS
Glucose Tolerance, 1 hour: 167 mg/dL (ref 70–189)
Glucose Tolerance, 2 hour: 127 mg/dL (ref 70–164)
Glucose Tolerance, Fasting: 94 mg/dL (ref 70–104)
Glucose, GTT - 3 Hour: 129 mg/dL (ref 70–144)

## 2012-06-24 ENCOUNTER — Encounter: Payer: Self-pay | Admitting: Obstetrics and Gynecology

## 2012-06-25 ENCOUNTER — Encounter: Payer: Self-pay | Admitting: Obstetrics & Gynecology

## 2012-07-01 ENCOUNTER — Encounter: Payer: Self-pay | Admitting: Family Medicine

## 2012-07-08 ENCOUNTER — Ambulatory Visit (INDEPENDENT_AMBULATORY_CARE_PROVIDER_SITE_OTHER): Payer: Self-pay | Admitting: Family Medicine

## 2012-07-08 VITALS — BP 130/80 | Temp 97.2°F | Wt 175.8 lb

## 2012-07-08 DIAGNOSIS — N949 Unspecified condition associated with female genital organs and menstrual cycle: Secondary | ICD-10-CM

## 2012-07-08 DIAGNOSIS — Z9889 Other specified postprocedural states: Secondary | ICD-10-CM

## 2012-07-08 DIAGNOSIS — Z98891 History of uterine scar from previous surgery: Secondary | ICD-10-CM

## 2012-07-08 DIAGNOSIS — Z3480 Encounter for supervision of other normal pregnancy, unspecified trimester: Secondary | ICD-10-CM

## 2012-07-08 DIAGNOSIS — O26899 Other specified pregnancy related conditions, unspecified trimester: Secondary | ICD-10-CM

## 2012-07-08 DIAGNOSIS — O9989 Other specified diseases and conditions complicating pregnancy, childbirth and the puerperium: Secondary | ICD-10-CM

## 2012-07-08 LAB — POCT URINALYSIS DIP (DEVICE)
Ketones, ur: NEGATIVE mg/dL
Protein, ur: NEGATIVE mg/dL
Urobilinogen, UA: 0.2 mg/dL (ref 0.0–1.0)
pH: 6.5 (ref 5.0–8.0)

## 2012-07-08 NOTE — Progress Notes (Signed)
Pulse: 83

## 2012-07-08 NOTE — Progress Notes (Signed)
Pain in lower abdomen when standing. Suggest maternity support belt. Lightheaded, vision changes on standing - increase fluid intake. No HA or RUQ pain. BP normal today. Does have hx of preeclampsia. Monitor BP and urine protein next visit. C/S scheduled for 08/12/12. Desires BTL - sign consent today. Medicaid pending.

## 2012-07-08 NOTE — Patient Instructions (Signed)
Dolor del ligamento redondo  (Round Ligament Pain)  El ligamento redondo se compone de músculo y tejido fibroso. Está unido al útero cerca de las trompas de [place] El ligamento redondo está ubicado en ambos lados del útero y ayuda a mantener su posición. Normalmente comienza en el segundo trimestre del embarazo cuando el útero sale hacia afuera de la pelvis. El dolor puede aparecer y desaparecer hasta el nacimiento del bebé. El dolor de ligamento redondo no es un problema serio y no ocasiona daños al bebé.  CAUSAS  Durante el embarazo el útero crece mayormente desde el segundo trimestre hasta el parto. A medida que crece, se estira y tuerce ligeramente los ligamentos. Cuando el útero ejerce presión en ambos lados, el ligamento redondo del lado opuesto presiona y se estira. Esto causa dolor.  SÍNTOMAS  El dolor puede ocurrir en uno o ambos lados. El dolor es por lo general como un pellizco corto y filoso. A veces puede ser un dolor opaco y persistente. Se siente en la parte baja del abdomen o en la ingle. Es un dolor interno, y por lo general comienza en la ingle y se mueve hacia la zona de la cadera. El dolor puede ocurrir con:  · Cambio de posición repentino como el levantarse de la cama o de una silla.  · Darse vuelta en la cama.  · Toser o estornudar.  · Caminar demasiado.  · Cualquier tipo de actividad física.  DIAGNÓSTICO  El medico deberá asegurarse de que no existen problemas graves que ocasionan el dolor. Si no encuentra nada grave, los síntomas suelen indicar de que se trata de un dolor proveniente del ligamento redondo.  TRATAMIENTO  · Siéntese y relájese cuando el dolor comience.  · Llevar las rodillas hacia el estómago.  · Recuéstese sobre un lado con una almohada debajo del vientre (abdomen) y otra entre sus piernas.  · Siéntese en un baño caliente de 15 a 20 minutos o hasta que el dolor desaparezca.  INSTRUCCIONES PARA EL CUIDADO DOMICILIARIO  · Utilice los medicamentos de venta libre o de  prescripción para el dolor, el malestar o la fiebre, según se lo indique el profesional que lo asiste.  · Siéntese y póngase de pie lentamente.  · Evite caminatas largas si le ocasionan dolor.  · Detenga o disminuya las actividades físicas si le ocasionan dolor.  SOLICITE ATENCIÓN MÉDICA SI:  · El dolor no desaparece con estas medidas.  · Necesita que le prescriban medicamentos más fuertes para el dolor.  · Desarrolla un dolor de espalda que no había sentido antes junto con el de lado.  SOLICITE ATENCIÓN MÉDICA DE INMEDIATO SI:  · La temperatura se eleva por encima de 102° F (38.9° C) o más.  · Siente contracciones uterinas.  · Presenta hemorragia vaginal.  · Presenta náuseas, diarrea o vómitos.  · Comienza a sentir escalofríos  · Siente dolor al orinar.  Document Released: 05/16/2008 Document Revised: 08/26/2011  ExitCare® Patient Information ©2013 [employer].

## 2012-07-22 ENCOUNTER — Ambulatory Visit (INDEPENDENT_AMBULATORY_CARE_PROVIDER_SITE_OTHER): Payer: Self-pay | Admitting: Family Medicine

## 2012-07-22 VITALS — BP 135/87 | Temp 97.8°F | Wt 176.6 lb

## 2012-07-22 DIAGNOSIS — N898 Other specified noninflammatory disorders of vagina: Secondary | ICD-10-CM

## 2012-07-22 DIAGNOSIS — O26899 Other specified pregnancy related conditions, unspecified trimester: Secondary | ICD-10-CM

## 2012-07-22 DIAGNOSIS — O9989 Other specified diseases and conditions complicating pregnancy, childbirth and the puerperium: Secondary | ICD-10-CM

## 2012-07-22 DIAGNOSIS — Z98891 History of uterine scar from previous surgery: Secondary | ICD-10-CM

## 2012-07-22 DIAGNOSIS — O34219 Maternal care for unspecified type scar from previous cesarean delivery: Secondary | ICD-10-CM

## 2012-07-22 DIAGNOSIS — Z3480 Encounter for supervision of other normal pregnancy, unspecified trimester: Secondary | ICD-10-CM

## 2012-07-22 LAB — POCT URINALYSIS DIP (DEVICE)
Glucose, UA: NEGATIVE mg/dL
Hgb urine dipstick: NEGATIVE
Nitrite: NEGATIVE
Protein, ur: 30 mg/dL — AB
Specific Gravity, Urine: >=1.03 (ref 1.005–1.030)
Urobilinogen, UA: 0.2 mg/dL (ref 0.0–1.0)

## 2012-07-22 NOTE — Patient Instructions (Addendum)
Embarazo  Tercer trimestre  (Pregnancy - Third Trimester) El tercer trimestre del embarazo (los ltimos 3 meses) es el perodo en el cual tanto usted como su beb crecen con ms rapidez. El beb alcanza un largo de aproximadamente 50 cm. y pesa entre 2,700 y 4,500 kg. El beb gana ms tejido graso y est listo para la vida fuera del cuerpo de la madre. Mientras estn en el interior, los bebs tienen perodos de sueo y vigilia, succionan el pulgar y tienen hipo. Quizs sienta pequeas contracciones del tero. Este es el falso trabajo de parto. Tambin se las conoce como contracciones de Braxton-Hicks . Es como una prctica del parto. Los problemas ms habituales de esta etapa del embarazo incluyen mayor dificultad para respirar, hinchazn de las manos y los pies por retencin de lquidos y la necesidad de orinar con ms frecuencia debido a que el tero y el beb presionan sobre la vejiga.  EXAMENES PRENATALES   Durante los exmenes prenatales, deber seguir realizndose anlisis de sangre. Estas pruebas se realizan para controlar su salud y la del beb. Los anlisis de sangre se realizan para conocer los niveles de algunos compuestos de la sangre (hemoglobina). La anemia (bajo nivel de hemoglobina) es frecuente durante el embarazo. Para prevenirla, se administran hierro y vitaminas. Tambin le tomarn nuevas anlisis para descartar diabetes. Podrn repetirle algunas de las pruebas que le hicieron previamente.  En cada visita le medirn el tamao del tero. Esto permite asegurar que el beb se desarrolla adecuadamente, segn la fecha del embarazo.  Le controlarn la presin arterial en cada visita prenatal. Esto es para asegurarse de que no sufre toxemia.  Le harn un anlisis de orina en cada visita prenatal, para descartar infecciones, diabetes y la presencia de protenas.  Tambin en cada visita controlarn su peso. Esto se realiza para asegurarse que aumenta de peso al ritmo indicado y que usted y su  beb evolucionan normalmente.  En algunas ocasiones se realiza una prueba de ultrasonido para confirmar el correcto desarrollo y evolucin del beb. Esta prueba se realiza con ondas sonoras inofensivas para el beb, de modo que el profesional pueda calcular ms precisamente la fecha del parto.  Analice con su mdico los analgsicos y la anestesia que recibir durante el trabajo de parto y el parto.  Comente la posibilidad de que necesite una cesrea y qu anestesia se recibir.  Informe a su mdico si sufre violencia familiar mental o fsica. A veces, se indica la prueba especializada sin estrs, la prueba de tolerancia a las contracciones y el perfil biofsico para asegurarse de que el beb no tiene problemas. El estudio del lquido amnitico que rodea al beb se llama amniocentesis. El lquido amnitico se obtiene introduciendo una aguja en el vientre (abdomen ). En ocasiones se lleva a cabo cerca del final del embarazo, si es necesario inducir a un parto. En este caso se realiza para asegurarse que los pulmones del beb estn lo suficientemente maduros como para que pueda vivir fuera del tero. Si los pulmones no han madurado y es peligroso que el beb nazca, se administrar a la madre una inyeccin de cortisona , 1 a 2 das antes del parto. . Esto ayuda a que los pulmones del beb maduren y sea ms seguro su nacimiento.  CAMBIOS QUE OCURREN EN EL TERCER TRIMESTRE DEL EMBARAZO  Su organismo atravesar numerosos cambios durante el embarazo. Estos pueden variar de una persona a otra. Converse con el profesional que la asiste acerca los cambios que   usted note y que la preocupen.   Durante el ltimo trimestre probablemente sienta un aumento del apetito. Es normal tener "antojos" de ciertas comidas. Esto vara de una persona a otra y de un embarazo a otro.  Podrn aparecer las primeras estras en las caderas, abdomen y mamas. Estos son cambios normales del cuerpo durante el embarazo. No existen  medicamentos ni ejercicios que puedan prevenir estos cambios.  La constipacin puede tratarse con un laxante o agregando fibra a su dieta. Beber grandes cantidades de lquidos, tomar fibras en forma de vegetales, frutas y granos integrales es de gran ayuda.  Tambin es beneficioso practicar actividad fsica. Si ha sido una persona activa hasta el embarazo, podr continuar con la mayora de las actividades durante el mismo. Si ha sido menos activa, puede ser beneficioso que comience con un programa de ejercicios, como realizar caminatas. Consulte con el profesional que la asiste antes de comenzar un programa de ejercicios.  Evite el consumo de cigarrillos, el alcohol, los medicamentos no recetados y las "drogas de la calle" durante el embarazo. Estas sustancias qumicas afectan la formacin y el desarrollo del beb. Evite estas sustancias durante todo el embarazo para asegurar el nacimiento de un beb sano.  Podr sentir dolor de espalda, tener vrices en las venas y hemorroides, o si ya los sufra, pueden empeorar.  Durante el tercer trimestre se cansar con ms facilidad, lo cual es normal.  Los movimientos del beb pueden ser ms fuertes y con ms frecuencia.  Puede que note dificultades para respirar normalmente.  El ombligo puede salir hacia afuera.  A veces sale una secrecin amarilla de las mamas, que se llama calostro.  Podr aparecer una secrecin mucosa con sangre. Esto suele ocurrir entre unos pocos das y una semana antes del parto. INSTRUCCIONES PARA EL CUIDADO EN EL HOGAR   Cumpla con las citas de control. Siga las indicaciones del mdico con respecto al uso de medicamentos, los ejercicios y la dieta.  Durante el embarazo debe obtener nutrientes para usted y para su beb. Consuma alimentos balanceados a intervalos regulares. Elija alimentos como carne, pescado, leche y otros productos lcteos descremados, vegetales, frutas, panes integrales y cereales. El mdico le informar  cul es el aumento de peso ideal.  Las relaciones sexuales pueden continuarse hasta casi el final del embarazo, si no se presentan otros problemas como prdida prematura (antes de tiempo) de lquido amnitico, hemorragia vaginal o dolor en el vientre (abdominal).  Realice actividad fsica todos los das, si no tiene restricciones. Consulte con el profesional que la asiste si no sabe con certeza si determinados ejercicios son seguros. El mayor aumento de peso se producir en los ltimos 2 trimestres del embarazo. El ejercicio ayuda a:  Controlar su peso.  Mantenerse en forma para el trabajo de parto y el parto .  Perder peso despus del parto.  Haga reposo con frecuencia, con las piernas elevadas, o segn lo necesite para evitar los calambres y el dolor de cintura.  Use un buen sostn o como los que se usan para hacer deportes para aliviar la sensibilidad de las mamas. Tambin puede serle til si lo usa mientras duerme. Si pierde calostro, podr utilizar apsitos en el sostn.  No utilice la baera con agua caliente, baos turcos y saunas.  Colquese el cinturn de seguridad cuando conduzca. Este la proteger a usted y al beb en caso de accidente.  Evite comer carne cruda y el contacto con los utensilios y desperdicios de los gatos. Estos elementos   contienen grmenes que pueden causar defectos de nacimiento en el beb.  Es fcil perder algo de orina durante el embarazo. Apretar y fortalecer los msculos de la pelvis la ayudar con este problema. Practique detener la miccin cuando est en el bao. Estos son los mismos msculos que necesita fortalecer. Son tambin los mismos msculos que utiliza cuando trata de evitar despedir gases. Puede practicar apretando estos msculos diez veces, y repetir esto tres veces por da aproximadamente. Una vez que conozca qu msculos debe apretar, no realice estos ejercicios durante la miccin. Puede favorecerle una infeccin si la orina vuelve hacia  atrs.  Pida ayuda si tienen necesidades financieras, teraputicas o nutricionales. El profesional podr ayudarla con respecto a estas necesidades, o derivarla a otros especialistas.  Haga una lista de nmeros telefnicos de emergencia y tngalos disponibles.  Planifique como obtener ayuda de familiares o amigos cuando regrese a casa desde el hospital.  Hacer un ensayo sobre la partida al hospital.  Tome clases prenatales con el padre para entender, practicar y hacer preguntas sobre el trabajo de parto y el alumbramiento.  Preparar la habitacin del beb / busque una guardera.  No viaje fuera de la ciudad a menos que sea absolutamente necesario y con el asesoramiento de su mdico.  Use slo zapatos de tacn bajo o sin tacn para tener mejor equilibrio y evitar cadas. USO DE MEDICAMENTOS Y CONSUMO DE DROGAS DURANTE EL EMBARAZO   Tome las vitaminas apropiadas para esta etapa tal como se le indic. Las vitaminas deben contener un miligramo de cido flico. Guarde todas las vitaminas fuera del alcance de los nios. La ingestin de slo un par de vitaminas o tabletas que contengan hierro pueden ocasionar la muerte en un beb o en un nio pequeo.  Evite el uso de todos los medicamentos, incluyendo hierbas, medicamentos de venta libre, sin receta o que no hayan sido sugeridos por su mdico. Slo tome medicamentos de venta libre o medicamentos recetados para el dolor, el malestar o fiebre como lo indique su mdico. No tome aspirina, ibuprofeno (Motrin, Advil, Nuprin) o naproxeno (Aleve) excepto que su mdico se lo indique.  Infrmele al profesional si consume alguna droga.  El alcohol se relaciona con ciertos defectos congnitos. Incluye el sndrome de alcoholismo fetal. Debe evitar absolutamente el consumo de alcohol, en cualquier forma. El fumar produce baja tasa de natalidad y bebs prematuros.  Las drogas ilegales o de la calle son muy perjudiciales para el beb. Estn absolutamente  prohibidas. Un beb que nace de una madre adicta, ser adicto al nacer. Ese beb tendr los mismos sntomas de abstinencia que un adulto. SOLICITE ATENCIN MDICA SI:  Tiene preguntas o preocupaciones relacionadas con el embarazo. Es mejor que llame para formular las preguntas si no puede esperar hasta la prxima visita, que sentirse preocupada por ellas.  DECISIONES ACERCA DE LA CIRCUNCISIN  Usted puede saber o no cul es el sexo de su beb. Si ya sabe que ser un varn, este es el momento de pensar acerca de la circuncisin. La circuncisin es la extirpacin del prepucio. Esta es la piel que cubre el extremo sensible del pene. No hay un motivo mdico que lo justifique. Generalmente la decisin se toma segn lo que sea popular en ese momento, o segn creencias religiosas. Podr conversar estos temas con su mdico o con el pediatra.  SOLICITE ATENCIN MDICA DE INMEDIATO SI:   La temperatura oral le sube a ms de 102 F (38.9 C) o lo que su mdico le   indique.  Tiene una prdida de lquido por la vagina (canal de parto). Si sospecha una ruptura de las membranas, tmese la temperatura y llame al profesional para informarlo sobre esto.  Observa unas pequeas manchas, una hemorragia vaginal o elimina cogulos. Notifique al profesional acerca de la cantidad y de cuntos apsitos est utilizando.  Presenta un olor desagradable en la secrecin vaginal y observa un cambio en el color, de transparente a blanco.  Ha vomitado durante ms de 24 horas.  Siente escalofros o le sube la fiebre.  Le falta el aire.  Siente ardor al orinar.  Baja o sube ms de 2 libras (900 g), o segn lo indicado por el profesional que la asiste.  Observa que sbitamente se le hinchan el rostro, las manos, los pies o las piernas.  Siente dolor en el vientre (abdominal). Las molestias en el ligamento redondo son una causa benigna frecuente de dolor abdominal durante el embarazo. El profesional que la asiste deber  evaluarla.  Presenta dolor de cabeza intenso que no se alivia.  Tiene problemas visuales, visin doble o borrosa.  Si no siente los movimientos del beb durante ms de 1 hora. Si piensa que el beb no se mueve tanto como lo haca habitualmente, coma algo que contenga azcar y recustese sobre el lado izquierdo durante una hora. El beb debe moverse al menos 4  5 veces por hora. Comunquese inmediatamente si el beb se mueve menos que lo indicado.  Se cae, se ve involucrada en un accidente automovilstico o sufre algn tipo de traumatismo.  En su hogar hay violencia mental o fsica. Document Released: 03/13/2005 Document Revised: 12/03/2011 Exit[employer] Patient Information 2013 [employer].  

## 2012-07-22 NOTE — Progress Notes (Signed)
Pulse 82 Pt has a lot of pain and pressure and contractions. Pt states that baby is moving but feels different than before.

## 2012-07-22 NOTE — Progress Notes (Signed)
Pt states often feels baby trembling/shaking. Fetal movement otherwise normal.

## 2012-07-25 LAB — CULTURE, BETA STREP (GROUP B ONLY)

## 2012-07-27 ENCOUNTER — Encounter: Payer: Self-pay | Admitting: Family Medicine

## 2012-07-29 ENCOUNTER — Encounter: Payer: Self-pay | Admitting: Advanced Practice Midwife

## 2012-07-29 ENCOUNTER — Ambulatory Visit (INDEPENDENT_AMBULATORY_CARE_PROVIDER_SITE_OTHER): Payer: Self-pay | Admitting: Advanced Practice Midwife

## 2012-07-29 VITALS — BP 140/89 | Temp 97.7°F | Wt 178.5 lb

## 2012-07-29 DIAGNOSIS — IMO0002 Reserved for concepts with insufficient information to code with codable children: Secondary | ICD-10-CM

## 2012-07-29 DIAGNOSIS — O34219 Maternal care for unspecified type scar from previous cesarean delivery: Secondary | ICD-10-CM

## 2012-07-29 DIAGNOSIS — O1203 Gestational edema, third trimester: Secondary | ICD-10-CM

## 2012-07-29 DIAGNOSIS — Z98891 History of uterine scar from previous surgery: Secondary | ICD-10-CM

## 2012-07-29 NOTE — Patient Instructions (Addendum)
Hipertensin durante el embarazo  (Hypertension During Pregnancy) La hipertensin tambin se denomina presin arterial alta. Puede ocurrir en cualquier momento de la vida y tambin durante el [place]. Cuando se sufre hipertensin, existe una presin extra en el interior de los vasos sanguneos que llevan la sangre desde el corazn al resto del cuerpo (arterias). La hipertensin durante el embarazo puede causar problemas para usted y el beb. Puede ser que el beb no tenga el peso adecuado al nacer o puede nacer antes de tiempo (prematuro). En los casos muy graves de hipertensin durante el embarazo puede estar en peligro la vida.  Hay diferentes tipos de hipertensin Academic librarian.   Hipertensin crnica. Esto sucede cuando una mujer sufre de hipertensin antes del embarazo y contina durante el mismo.  Hipertensin gestacional. Es cuando se desarrolla la hipertensin Academic librarian.  Preeclampsia o toxemia del embarazo. Es un tipo muy grave de hipertensin que se desarrolla slo Academic librarian. Es una enfermedad que afecta a todo el cuerpo (sistmica) y puede ser muy peligrosa tanto para la madre como para el beb.  La hipertensin gestacional y preeclampsia por lo general desaparecen despus de nacer el beb. La presin arterial generalmente se estabiliza dentro de las 6 semanas. Las mujeres que sufren de hipertensin durante el embarazo tienen una mayor probabilidad de Environmental education officer hipertensin en etapas posteriores de la vida o en embarazos futuros.  ENTENDER LA PRESIN ARTERIAL  La presin arterial hace que se mueva la sangre en el cuerpo. A veces, la fuerza que [employer] sangre es demasiado intensa.   La lectura de la presin arterial se expresa en 2 nmeros y se ve como una fraccin.  El primer nmero es la presin sistlica. Cuando el corazn late, fuerza a que fluya ms sangre por las arterias. La presin dentro de las arterias [place].  El nmero inferior es la presin  diastlica. La presin baja [employer] latidos. Eso ocurre cuando el corazn est en reposo.  Usted puede tener hipertensin si:  La presin arterial sistlica es superior a 140.  La presin diastlica es superior a 90. FACTORES DE RIESGO  Algunos factores favorecen el desarrollo de la hipertensin Academic librarian. Los factores de riesgo son:   Sufrir hipertensin antes del Psychiatrist.  Haber sufrido hipertensin durante un embarazo anterior.  Tener sobrepeso.  Ser mayor de 40 aos.  Estar embarazada de ms de un beb (mltiples).  Tener diabetes o problemas renales. SNTOMAS  La hipertensin gestacional y crnica pueden no causar sntomas. La preeclampsia causa sntomas, que pueden ser:   Aumento de las protenas en la orina. El mdico va a controlar esto en cada control prenatal.  Hinchazn de las manos y la cara.  Aumento rpido de [place].  Dolores de [place].  Cambios visuales.  Molestias al ver la luz.  Dolor abdominal, especialmente en el rea superior derecha.  Dolor en el pecho.  Falta de aire.  Aumento de los reflejos.  Convulsiones. Las convulsiones ocurren en una forma ms grave de preeclampsia, llamada eclampsia. DIAGNSTICO   Puede ser diagnosticada con hipertensin en el embarazo durante un control prenatal regular. En cada visita, las pruebas pueden ser:  Control de la presin arterial.  Anlisis de orina para detectar protenas en la orina.  El tipo de hipertensin que se diagnostica depende del momento en que se desarroll. Tambin depende de la lectura de su presin arterial especfica.  El desarrollo de hipertensin antes de las 20 semanas de embarazo es consistente con  hipertensin crnica.  El desarrollo de la hipertensin despus de las 20 semanas de embarazo es consistente con hipertensin gestacional.  Hipertensin con aumento de la protena urinaria se diagnostica como preeclampsia.  Las mediciones de la presin arterial de ms de  160 sistlica o 110 diastlica son un signo de preeclampsia grave. TRATAMIENTO  El tratamiento para la hipertensin durante el embarazo vara. Depende del tipo de hipertensin y de su gravedad.   Si toma medicamentos para la hipertensin crnica, puede que tenga que cambiarlos.  Los medicamentos llamados inhibidores de la ECA no deben tomarse Academic librarian.  Para las mujeres que tienen factores de riesgo de preeclampsia pueden recomendarse bajas dosis de aspirina.  Si usted tiene Occupational hygienist, tendr que tomar un medicamento para la presin arterial que sea seguro durante el [place]. Su mdico le Paediatric nurse apropiado.  Si tiene preeclampsia grave, es posible que tenga que Engineer, maintenance hospital. Los mdicos la controlarn a usted y al beb muy de cerca. Puede ser que necesite tomar medicamentos (sulfato de magnesio) para prevenir las convulsiones y reducir la presin arterial.  A veces es necesario un parto prematuro. Este puede ser el caso si el problema [place]. Se hace para protegerlos a usted y a su beb. La nica cura para la preeclampsia es el parto. INSTRUCCIONES PARA EL CUIDADO EN EL HOGAR   Cumpla con todos los controles prenatales regulares.  Siga las indicaciones del profesional con respecto a Adult nurse. Dgale a su mdico sobre todos los [employer] toma. Incluya los medicamentos de [place].  Consuma la menor cantidad posible de sal.  Realice actividad fsica con regularidad.  No beba alcohol.  No use productos que contengan tabaco.  No tome bebidas con cafena.  Acustese sobre el lado izquierdo cuando haga reposo.  Informe a su mdico si tiene sntomas de preeclampsia. SOLICITE ATENCIN MDICA DE INMEDIATO SI:   Siente un dolor abdominal intenso.  Observa hinchazn repentina y [employer], tobillos o el rostro.  Aumento de peso de ms de 4 libras (1,8 kg) o ms en una semana.  Vomita  repetidas veces.  Presenta una hemorragia vaginal abundante.  No siente los movimientos del beb.  Sufre una cefalea grave.  Tiene visin doble o borrosa.  Tiene calambres o espasmos musculares.  Le falta el aire.  Tiene las yemas de los dedos y los labios [place].  Observa sangre en la orina. ASEGRESE DE QUE:   Comprende estas instrucciones.  Controlar su enfermedad.  Solicitar ayuda de inmediato si no mejora o si empeora. Document Released: 05/23/2011 Document Revised: 08/26/2011 [facility] Patient Information 2013 [place], [place].

## 2012-07-29 NOTE — Progress Notes (Signed)
S: Penny Spencer presents for follow up. Doing well. No complaints today.  Reports thin vaginal discharge.  No vaginal bleeding or loss of fluid.  Reports contractions every hour, but states they are brief (last approximately 3 mins).    O: See flowsheet  A/P:  24 hour urine protein ordered given elevated BP and proteinuria CBC and CMET ordered following 24 hour urine collection Follow up in 1 week.  Signs/symptoms regarding pre-eclampsia reviewed.  C/S scheduled for 08/12/12.

## 2012-07-29 NOTE — Progress Notes (Signed)
Seen and agree with note. BP elevated last week and today.  No symptoms, denies HA or vision changes.  Trace edema. Will order 24 hr urine with labs. Preeclampsia precautions given.

## 2012-08-05 ENCOUNTER — Encounter (HOSPITAL_COMMUNITY): Payer: Self-pay | Admitting: Anesthesiology

## 2012-08-05 ENCOUNTER — Ambulatory Visit (INDEPENDENT_AMBULATORY_CARE_PROVIDER_SITE_OTHER): Payer: Self-pay | Admitting: Obstetrics and Gynecology

## 2012-08-05 ENCOUNTER — Other Ambulatory Visit: Payer: Self-pay | Admitting: Obstetrics and Gynecology

## 2012-08-05 ENCOUNTER — Encounter (HOSPITAL_COMMUNITY): Payer: Self-pay

## 2012-08-05 ENCOUNTER — Inpatient Hospital Stay (HOSPITAL_COMMUNITY)
Admission: AD | Admit: 2012-08-05 | Discharge: 2012-08-08 | DRG: 766 | Disposition: A | Discharge status: Home / Self Care | Payer: Medicaid Other | Source: Ambulatory Visit | Attending: Obstetrics & Gynecology | Admitting: Obstetrics & Gynecology

## 2012-08-05 ENCOUNTER — Inpatient Hospital Stay (HOSPITAL_COMMUNITY): Payer: Medicaid Other | Admitting: Anesthesiology

## 2012-08-05 ENCOUNTER — Encounter (HOSPITAL_COMMUNITY)
Admission: AD | Disposition: A | Discharge status: Home / Self Care | Payer: Self-pay | Source: Ambulatory Visit | Attending: Obstetrics & Gynecology

## 2012-08-05 VITALS — BP 150/87 | Temp 96.8°F | Wt 179.4 lb

## 2012-08-05 DIAGNOSIS — IMO0002 Reserved for concepts with insufficient information to code with codable children: Secondary | ICD-10-CM

## 2012-08-05 DIAGNOSIS — D649 Anemia, unspecified: Secondary | ICD-10-CM

## 2012-08-05 DIAGNOSIS — O9903 Anemia complicating the puerperium: Secondary | ICD-10-CM | POA: Diagnosis not present

## 2012-08-05 DIAGNOSIS — Z302 Encounter for sterilization: Secondary | ICD-10-CM

## 2012-08-05 DIAGNOSIS — Z3480 Encounter for supervision of other normal pregnancy, unspecified trimester: Secondary | ICD-10-CM

## 2012-08-05 DIAGNOSIS — O09299 Supervision of pregnancy with other poor reproductive or obstetric history, unspecified trimester: Secondary | ICD-10-CM

## 2012-08-05 DIAGNOSIS — O163 Unspecified maternal hypertension, third trimester: Secondary | ICD-10-CM

## 2012-08-05 DIAGNOSIS — O139 Gestational [pregnancy-induced] hypertension without significant proteinuria, unspecified trimester: Secondary | ICD-10-CM

## 2012-08-05 DIAGNOSIS — O34219 Maternal care for unspecified type scar from previous cesarean delivery: Secondary | ICD-10-CM

## 2012-08-05 DIAGNOSIS — O9989 Other specified diseases and conditions complicating pregnancy, childbirth and the puerperium: Secondary | ICD-10-CM

## 2012-08-05 LAB — POCT URINALYSIS DIP (DEVICE)
Hgb urine dipstick: NEGATIVE
Nitrite: NEGATIVE
Protein, ur: 100 mg/dL — AB
Specific Gravity, Urine: 1.02 (ref 1.005–1.030)
Urobilinogen, UA: 0.2 mg/dL (ref 0.0–1.0)

## 2012-08-05 LAB — CBC
MCH: 26.9 pg (ref 26.0–34.0)
MCV: 83.1 fL (ref 78.0–100.0)
Platelets: 205 10*3/uL (ref 150–400)
RDW: 14.5 % (ref 11.5–15.5)

## 2012-08-05 LAB — COMPREHENSIVE METABOLIC PANEL
AST: 15 U/L (ref 0–37)
Albumin: 2.5 g/dL — ABNORMAL LOW (ref 3.5–5.2)
Calcium: 8.5 mg/dL (ref 8.4–10.5)
Creatinine, Ser: 0.58 mg/dL (ref 0.50–1.10)
Sodium: 137 mEq/L (ref 135–145)
Total Protein: 5.8 g/dL — ABNORMAL LOW (ref 6.0–8.3)

## 2012-08-05 LAB — TYPE AND SCREEN

## 2012-08-05 LAB — PROTEIN / CREATININE RATIO, URINE
Creatinine, Urine: 153.88 mg/dL
Protein Creatinine Ratio: 0.38 — ABNORMAL HIGH (ref 0.00–0.15)
Total Protein, Urine: 58 mg/dL

## 2012-08-05 LAB — URINALYSIS, ROUTINE W REFLEX MICROSCOPIC
Glucose, UA: NEGATIVE mg/dL
Hgb urine dipstick: NEGATIVE
Specific Gravity, Urine: 1.02 (ref 1.005–1.030)
Urobilinogen, UA: 0.2 mg/dL (ref 0.0–1.0)
pH: 6 (ref 5.0–8.0)

## 2012-08-05 LAB — URINE MICROSCOPIC-ADD ON

## 2012-08-05 SURGERY — Surgical Case
Anesthesia: Spinal | Site: Abdomen | Wound class: Clean Contaminated

## 2012-08-05 MED ORDER — LANOLIN HYDROUS EX OINT: 1.0000 "application " | TOPICAL_OINTMENT | CUTANEOUS | Status: DC | PRN

## 2012-08-05 MED ORDER — OXYTOCIN 10 UNIT/ML IJ SOLN
INTRAMUSCULAR | Status: DC | PRN
  Administered 2012-08-05: 40 [IU] via INTRAMUSCULAR

## 2012-08-05 MED ORDER — CEFAZOLIN SODIUM-DEXTROSE 2-3 GM-% IV SOLR
2.0000 g | INTRAVENOUS | Status: AC
  Administered 2012-08-05: 2 g via INTRAVENOUS

## 2012-08-05 MED ORDER — MAGNESIUM SULFATE 40 G IN LACTATED RINGERS - SIMPLE
2.0000 g/h | INTRAVENOUS | Status: AC
  Administered 2012-08-05: 2 g/h via INTRAVENOUS
  Filled 2012-08-05: qty 500

## 2012-08-05 MED ORDER — METOCLOPRAMIDE HCL 5 MG/ML IJ SOLN
10.0000 mg | Freq: Three times a day (TID) | INTRAMUSCULAR | Status: DC | PRN
  Administered 2012-08-05: 10 mg via INTRAVENOUS

## 2012-08-05 MED ORDER — METOCLOPRAMIDE HCL 5 MG/ML IJ SOLN
INTRAMUSCULAR | Status: AC
  Administered 2012-08-05: 10 mg via INTRAVENOUS
  Filled 2012-08-05: qty 2

## 2012-08-05 MED ORDER — ONDANSETRON HCL 4 MG/2ML IJ SOLN
INTRAMUSCULAR | Status: AC
  Filled 2012-08-05: qty 2

## 2012-08-05 MED ORDER — BUPIVACAINE HCL (PF) 0.25 % IJ SOLN
INTRAMUSCULAR | Status: AC
  Filled 2012-08-05: qty 30

## 2012-08-05 MED ORDER — NALBUPHINE HCL 10 MG/ML IJ SOLN: 5.0000 mg | INTRAMUSCULAR | Status: DC | PRN

## 2012-08-05 MED ORDER — KETOROLAC TROMETHAMINE 30 MG/ML IJ SOLN
30.0000 mg | Freq: Four times a day (QID) | INTRAMUSCULAR | Status: AC | PRN
  Filled 2012-08-05: qty 1

## 2012-08-05 MED ORDER — KETOROLAC TROMETHAMINE 60 MG/2ML IM SOLN
60.0000 mg | Freq: Once | INTRAMUSCULAR | Status: AC | PRN
  Administered 2012-08-05: 60 mg via INTRAMUSCULAR

## 2012-08-05 MED ORDER — MENTHOL 3 MG MT LOZG: 1.0000 | LOZENGE | OROMUCOSAL | Status: DC | PRN

## 2012-08-05 MED ORDER — FERROUS SULFATE 325 (65 FE) MG PO TABS
325.0000 mg | ORAL_TABLET | Freq: Two times a day (BID) | ORAL | Status: DC
  Administered 2012-08-06 – 2012-08-08 (×5): 325 mg via ORAL
  Filled 2012-08-05 (×5): qty 1

## 2012-08-05 MED ORDER — OXYCODONE-ACETAMINOPHEN 5-325 MG PO TABS
1.0000 | ORAL_TABLET | ORAL | Status: DC | PRN
  Administered 2012-08-07: 1 via ORAL
  Filled 2012-08-05: qty 1

## 2012-08-05 MED ORDER — DIPHENHYDRAMINE HCL 50 MG/ML IJ SOLN: 25.0000 mg | INTRAMUSCULAR | Status: DC | PRN

## 2012-08-05 MED ORDER — ONDANSETRON HCL 4 MG/2ML IJ SOLN: 4.0000 mg | Freq: Three times a day (TID) | INTRAMUSCULAR | Status: DC | PRN

## 2012-08-05 MED ORDER — NALOXONE HCL 1 MG/ML IJ SOLN: 1.0000 ug/kg/h | INTRAMUSCULAR | Status: DC | PRN

## 2012-08-05 MED ORDER — IBUPROFEN 600 MG PO TABS: 600.0000 mg | ORAL_TABLET | Freq: Four times a day (QID) | ORAL | Status: DC | PRN

## 2012-08-05 MED ORDER — MAGNESIUM SULFATE BOLUS VIA INFUSION
4.0000 g | Freq: Once | INTRAVENOUS | Status: DC
  Filled 2012-08-05: qty 500

## 2012-08-05 MED ORDER — DIPHENHYDRAMINE HCL 25 MG PO CAPS
25.0000 mg | ORAL_CAPSULE | ORAL | Status: DC | PRN
  Filled 2012-08-05: qty 1

## 2012-08-05 MED ORDER — TETANUS-DIPHTH-ACELL PERTUSSIS 5-2.5-18.5 LF-MCG/0.5 IM SUSP
0.5000 mL | Freq: Once | INTRAMUSCULAR | Status: AC
  Administered 2012-08-06: 0.5 mL via INTRAMUSCULAR
  Filled 2012-08-05: qty 0.5

## 2012-08-05 MED ORDER — FLEET ENEMA 7-19 GM/118ML RE ENEM: 1.0000 | ENEMA | Freq: Every day | RECTAL | Status: DC | PRN

## 2012-08-05 MED ORDER — DIPHENHYDRAMINE HCL 50 MG/ML IJ SOLN: 12.5000 mg | INTRAMUSCULAR | Status: DC | PRN

## 2012-08-05 MED ORDER — BUPIVACAINE IN DEXTROSE 0.75-8.25 % IT SOLN
INTRATHECAL | Status: DC | PRN
  Administered 2012-08-05: 1.1 mL via INTRATHECAL

## 2012-08-05 MED ORDER — SIMETHICONE 80 MG PO CHEW
80.0000 mg | CHEWABLE_TABLET | Freq: Three times a day (TID) | ORAL | Status: DC
  Administered 2012-08-05 – 2012-08-08 (×9): 80 mg via ORAL

## 2012-08-05 MED ORDER — FENTANYL CITRATE 0.05 MG/ML IJ SOLN
INTRAMUSCULAR | Status: DC | PRN
  Administered 2012-08-05: 15 ug via INTRATHECAL

## 2012-08-05 MED ORDER — DIBUCAINE 1 % RE OINT: 1.0000 "application " | TOPICAL_OINTMENT | RECTAL | Status: DC | PRN

## 2012-08-05 MED ORDER — NALOXONE HCL 0.4 MG/ML IJ SOLN: 0.4000 mg | INTRAMUSCULAR | Status: DC | PRN

## 2012-08-05 MED ORDER — MORPHINE SULFATE (PF) 0.5 MG/ML IJ SOLN
INTRAMUSCULAR | Status: DC | PRN
  Administered 2012-08-05: .1 mg via INTRATHECAL

## 2012-08-05 MED ORDER — LACTATED RINGERS IV SOLN
INTRAVENOUS | Status: DC
  Administered 2012-08-06 (×2): via INTRAVENOUS

## 2012-08-05 MED ORDER — ONDANSETRON HCL 4 MG/2ML IJ SOLN: 4.0000 mg | INTRAMUSCULAR | Status: DC | PRN

## 2012-08-05 MED ORDER — WITCH HAZEL-GLYCERIN EX PADS: 1.0000 "application " | MEDICATED_PAD | CUTANEOUS | Status: DC | PRN

## 2012-08-05 MED ORDER — SENNOSIDES-DOCUSATE SODIUM 8.6-50 MG PO TABS
2.0000 | ORAL_TABLET | Freq: Every day | ORAL | Status: DC
  Administered 2012-08-05 – 2012-08-06 (×2): 2 via ORAL

## 2012-08-05 MED ORDER — CEFAZOLIN SODIUM-DEXTROSE 2-3 GM-% IV SOLR
INTRAVENOUS | Status: AC
  Filled 2012-08-05: qty 50

## 2012-08-05 MED ORDER — DIPHENHYDRAMINE HCL 25 MG PO CAPS: 25.0000 mg | ORAL_CAPSULE | Freq: Four times a day (QID) | ORAL | Status: DC | PRN

## 2012-08-05 MED ORDER — BISACODYL 10 MG RE SUPP: 10.0000 mg | Freq: Every day | RECTAL | Status: DC | PRN

## 2012-08-05 MED ORDER — FENTANYL CITRATE 0.05 MG/ML IJ SOLN
INTRAMUSCULAR | Status: DC | PRN
  Administered 2012-08-05: 25 ug via INTRAVENOUS

## 2012-08-05 MED ORDER — FENTANYL CITRATE 0.05 MG/ML IJ SOLN: 25.0000 ug | INTRAMUSCULAR | Status: DC | PRN

## 2012-08-05 MED ORDER — MEASLES, MUMPS & RUBELLA VAC ~~LOC~~ INJ: 0.5000 mL | INJECTION | Freq: Once | SUBCUTANEOUS | Status: DC

## 2012-08-05 MED ORDER — FAMOTIDINE IN NACL 20-0.9 MG/50ML-% IV SOLN
INTRAVENOUS | Status: AC
  Administered 2012-08-05: 20 mg
  Filled 2012-08-05: qty 50

## 2012-08-05 MED ORDER — DIPHENHYDRAMINE HCL 50 MG/ML IJ SOLN
INTRAMUSCULAR | Status: AC
  Administered 2012-08-05: 50 mg
  Filled 2012-08-05: qty 1

## 2012-08-05 MED ORDER — KETOROLAC TROMETHAMINE 30 MG/ML IJ SOLN: 30.0000 mg | Freq: Four times a day (QID) | INTRAMUSCULAR | Status: AC | PRN

## 2012-08-05 MED ORDER — DEXTROSE 5 % IV SOLN: 2.0000 g | INTRAVENOUS | Status: DC

## 2012-08-05 MED ORDER — MORPHINE SULFATE 0.5 MG/ML IJ SOLN
INTRAMUSCULAR | Status: AC
  Filled 2012-08-05: qty 10

## 2012-08-05 MED ORDER — OXYTOCIN 10 UNIT/ML IJ SOLN
INTRAMUSCULAR | Status: AC
  Filled 2012-08-05: qty 4

## 2012-08-05 MED ORDER — KETOROLAC TROMETHAMINE 60 MG/2ML IM SOLN
INTRAMUSCULAR | Status: AC
  Administered 2012-08-05: 60 mg via INTRAMUSCULAR
  Filled 2012-08-05: qty 2

## 2012-08-05 MED ORDER — SIMETHICONE 80 MG PO CHEW: 80.0000 mg | CHEWABLE_TABLET | ORAL | Status: DC | PRN

## 2012-08-05 MED ORDER — ZOLPIDEM TARTRATE 5 MG PO TABS: 5.0000 mg | ORAL_TABLET | Freq: Every evening | ORAL | Status: DC | PRN

## 2012-08-05 MED ORDER — ONDANSETRON HCL 4 MG/2ML IJ SOLN
INTRAMUSCULAR | Status: DC | PRN
  Administered 2012-08-05: 4 mg via INTRAVENOUS

## 2012-08-05 MED ORDER — FENTANYL CITRATE 0.05 MG/ML IJ SOLN
INTRAMUSCULAR | Status: AC
  Filled 2012-08-05: qty 2

## 2012-08-05 MED ORDER — ONDANSETRON HCL 4 MG PO TABS: 4.0000 mg | ORAL_TABLET | ORAL | Status: DC | PRN

## 2012-08-05 MED ORDER — LACTATED RINGERS IV SOLN: INTRAVENOUS | Status: DC

## 2012-08-05 MED ORDER — PHENYLEPHRINE HCL 10 MG/ML IJ SOLN
INTRAMUSCULAR | Status: DC | PRN
  Administered 2012-08-05: 80 ug via INTRAVENOUS

## 2012-08-05 MED ORDER — MEPERIDINE HCL 25 MG/ML IJ SOLN: 6.2500 mg | INTRAMUSCULAR | Status: DC | PRN

## 2012-08-05 MED ORDER — FAMOTIDINE IN NACL 20-0.9 MG/50ML-% IV SOLN: 20.0000 mg | Freq: Once | INTRAVENOUS | Status: DC

## 2012-08-05 MED ORDER — CITRIC ACID-SODIUM CITRATE 334-500 MG/5ML PO SOLN
30.0000 mL | Freq: Once | ORAL | Status: AC
  Administered 2012-08-05: 30 mL via ORAL
  Filled 2012-08-05: qty 15

## 2012-08-05 MED ORDER — LACTATED RINGERS IV SOLN
INTRAVENOUS | Status: DC
  Administered 2012-08-05 (×2): via INTRAVENOUS

## 2012-08-05 MED ORDER — PHENYLEPHRINE 40 MCG/ML (10ML) SYRINGE FOR IV PUSH (FOR BLOOD PRESSURE SUPPORT)
PREFILLED_SYRINGE | INTRAVENOUS | Status: AC
  Filled 2012-08-05: qty 5

## 2012-08-05 MED ORDER — OXYTOCIN 40 UNITS IN LACTATED RINGERS INFUSION - SIMPLE MED: 62.5000 mL/h | INTRAVENOUS | Status: AC

## 2012-08-05 MED ORDER — SCOPOLAMINE 1 MG/3DAYS TD PT72
MEDICATED_PATCH | TRANSDERMAL | Status: AC
  Administered 2012-08-05: 1.5 mg via TRANSDERMAL
  Filled 2012-08-05: qty 1

## 2012-08-05 MED ORDER — SCOPOLAMINE 1 MG/3DAYS TD PT72
1.0000 | MEDICATED_PATCH | Freq: Once | TRANSDERMAL | Status: DC
  Administered 2012-08-05: 1.5 mg via TRANSDERMAL

## 2012-08-05 MED ORDER — IBUPROFEN 600 MG PO TABS
600.0000 mg | ORAL_TABLET | Freq: Four times a day (QID) | ORAL | Status: DC
  Administered 2012-08-06 – 2012-08-08 (×11): 600 mg via ORAL
  Filled 2012-08-05 (×10): qty 1

## 2012-08-05 MED ORDER — SODIUM CHLORIDE 0.9 % IJ SOLN: 3.0000 mL | INTRAMUSCULAR | Status: DC | PRN

## 2012-08-05 MED ORDER — PRENATAL MULTIVITAMIN CH
1.0000 | ORAL_TABLET | Freq: Every day | ORAL | Status: DC
  Administered 2012-08-06 – 2012-08-08 (×3): 1 via ORAL
  Filled 2012-08-05 (×3): qty 1

## 2012-08-05 SURGICAL SUPPLY — 32 items
BARRIER ADHS 3X4 INTERCEED (GAUZE/BANDAGES/DRESSINGS) IMPLANT
CLOTH BEACON ORANGE TIMEOUT ST (SAFETY) ×2 IMPLANT
DRAPE LG THREE QUARTER DISP (DRAPES) ×2 IMPLANT
DRSG OPSITE POSTOP 4X10 (GAUZE/BANDAGES/DRESSINGS) ×2 IMPLANT
DURAPREP 26ML APPLICATOR (WOUND CARE) ×2 IMPLANT
ELECT REM PT RETURN 9FT ADLT (ELECTROSURGICAL) ×2
ELECTRODE REM PT RTRN 9FT ADLT (ELECTROSURGICAL) ×1 IMPLANT
EXTRACTOR VACUUM KIWI (MISCELLANEOUS) IMPLANT
GLOVE BIO SURGEON STRL SZ 6.5 (GLOVE) ×2 IMPLANT
GLOVE BIOGEL PI IND STRL 7.0 (GLOVE) ×2 IMPLANT
GLOVE BIOGEL PI INDICATOR 7.0 (GLOVE) ×2
GOWN PREVENTION PLUS LG XLONG (DISPOSABLE) ×6 IMPLANT
KIT ABG SYR 3ML LUER SLIP (SYRINGE) IMPLANT
NEEDLE HYPO 25X5/8 SAFETYGLIDE (NEEDLE) ×2 IMPLANT
NS IRRIG 1000ML POUR BTL (IV SOLUTION) ×2 IMPLANT
PACK C SECTION WH (CUSTOM PROCEDURE TRAY) ×2 IMPLANT
PAD OB MATERNITY 4.3X12.25 (PERSONAL CARE ITEMS) ×2 IMPLANT
SLEEVE SCD COMPRESS KNEE LRG (MISCELLANEOUS) ×2 IMPLANT
SLEEVE SCD COMPRESS KNEE MED (MISCELLANEOUS) IMPLANT
STAPLER VISISTAT 35W (STAPLE) ×2 IMPLANT
SUT PLAIN 2 0 (SUTURE) ×3
SUT PLAIN 2 0 XLH (SUTURE) ×2 IMPLANT
SUT PLAIN ABS 2-0 CT1 27XMFL (SUTURE) ×3 IMPLANT
SUT VIC AB 0 CT1 36 (SUTURE) ×12 IMPLANT
SUT VIC AB 0 CTX 36 (SUTURE) ×1
SUT VIC AB 0 CTX36XBRD ANBCTRL (SUTURE) ×1 IMPLANT
SUT VIC AB 2-0 CT1 27 (SUTURE) ×1
SUT VIC AB 2-0 CT1 TAPERPNT 27 (SUTURE) ×1 IMPLANT
SUT VIC AB 4-0 PS2 27 (SUTURE) ×2 IMPLANT
TOWEL OR 17X24 6PK STRL BLUE (TOWEL DISPOSABLE) ×6 IMPLANT
TRAY FOLEY CATH 14FR (SET/KITS/TRAYS/PACK) ×2 IMPLANT
WATER STERILE IRR 1000ML POUR (IV SOLUTION) IMPLANT

## 2012-08-05 NOTE — MAU Provider Note (Signed)
History     CSN: [id number]  Arrival date and time: 08/05/12 1042   None     Chief Complaint  Patient presents with  . Hypertension   HPI 27 y/o [id number] here sent here from clinic for HTN and evaluation for pre-eclampsia. PAtient is feeling well today and having irregular strong contraction for a few days,. She denies vaginal bleeding, LOF, and change in vaginal discharge (white). She denies Headache, scotoma, blurred vision adn RUQ pain. She has been swelling more than usual for the last 2 weeks. Her only complaint is leg/foot numnbess and tingling taht cause her legs to "give out" so that she has to sit down and connot walk about 3-4 times a day for 2 weeks. She's also having a burning sensation of her skin on her BL LE.  She denies fever, chills, dyspea, chest pain, dysuria, and constipation or changes in her bowel habits.   She had pre-eclampsia in a previous pregnancy and had a C-Section at that time. She is scheduled for a CS next week.   OB History   Grav Para Term Preterm Abortions TAB SAB Ect Mult Living   4 2 2  1  1   2       Past Medical History  Diagnosis Date  . Pregnancy induced hypertension   . Preeclampsia 2007  . No pertinent past medical history     Past Surgical History  Procedure Laterality Date  . Cesarean section      x2  . No past surgeries      Family History  Problem Relation Age of Onset  . Diabetes Father     History  Substance Use Topics  . Smoking status: Never Smoker   . Smokeless tobacco: Never Used  . Alcohol Use: No    Allergies: No Known Allergies  No prescriptions prior to admission    ROS per HPI Physical Exam   Blood pressure 151/76, pulse 70, temperature 98.6 F (37 C), temperature source Oral, resp. rate 18, height 4\' 9"  (1.448 m), weight 81.194 kg (179 lb), SpO2 100.00%.  Physical Exam Gen: NAD, alert, cooperative with exam  HEENT: NCAT, EOMI, PERRL  CV: RRR, good S1/S2, no murmur  Resp: CTABL, no wheezes,  non-labored  Abd: Soft, pregnant abdomen, no tenderness to palpation  Ext: 1-2+ pitting edema of BL legs  Neuro: Alert and oriented, strength 5/5 in BL LE, sensation in tact in BL LE  MAU Course  Procedures  MDM Results for orders placed during the hospital encounter of 08/05/12 (from the past 24 hour(s))  URINALYSIS, ROUTINE W REFLEX MICROSCOPIC     Status: Abnormal   Collection Time    08/05/12 11:06 AM      Result Value Range   Color, Urine YELLOW  YELLOW   APPearance CLOUDY (*) CLEAR   Specific Gravity, Urine 1.020  1.005 - 1.030   pH 6.0  5.0 - 8.0   Glucose, UA NEGATIVE  NEGATIVE mg/dL   Hgb urine dipstick NEGATIVE  NEGATIVE   Bilirubin Urine NEGATIVE  NEGATIVE   Ketones, ur NEGATIVE  NEGATIVE mg/dL   Protein, ur 30 (*) NEGATIVE mg/dL   Urobilinogen, UA 0.2  0.0 - 1.0 mg/dL   Nitrite POSITIVE (*) NEGATIVE   Leukocytes, UA TRACE (*) NEGATIVE  URINE MICROSCOPIC-ADD ON     Status: Abnormal   Collection Time    08/05/12 11:06 AM      Result Value Range   Squamous Epithelial / LPF MANY (*) RARE  WBC, UA 3-6  <3 WBC/hpf   Bacteria, UA FEW (*) RARE   Urine-Other MUCOUS PRESENT    CBC     Status: Abnormal   Collection Time    08/05/12 11:10 AM      Result Value Range   WBC 8.8  4.0 - 10.5 K/uL   RBC 3.91  3.87 - 5.11 MIL/uL   Hemoglobin 10.5 (*) 12.0 - 15.0 g/dL   HCT 16.1 (*) 09.6 - 04.5 %   MCV 83.1  78.0 - 100.0 fL   MCH 26.9  26.0 - 34.0 pg   MCHC 32.3  30.0 - 36.0 g/dL   RDW 40.9  81.1 - 91.4 %   Platelets 205  150 - 400 K/uL  COMPREHENSIVE METABOLIC PANEL     Status: Abnormal   Collection Time    08/05/12 11:10 AM      Result Value Range   Sodium 137  135 - 145 mEq/L   Potassium 4.1  3.5 - 5.1 mEq/L   Chloride 106  96 - 112 mEq/L   CO2 21  19 - 32 mEq/L   Glucose, Bld 83  70 - 99 mg/dL   BUN 11  6 - 23 mg/dL   Creatinine, Ser 7.82  0.50 - 1.10 mg/dL   Calcium 8.5  8.4 - 95.6 mg/dL   Total Protein 5.8 (*) 6.0 - 8.3 g/dL   Albumin 2.5 (*) 3.5 - 5.2 g/dL    AST 15  0 - 37 U/L   ALT 9  0 - 35 U/L   Alkaline Phosphatase 155 (*) 39 - 117 U/L   Total Bilirubin 0.3  0.3 - 1.2 mg/dL   GFR calc non Af Amer >90  >90 mL/min   GFR calc Af Amer >90  >90 mL/min   Glucose screen: 1hr 139, 2 hour 167, 127   Prenatal labs: ABO, Rh: O/POS/-- (08/12 1050) Antibody: NEG (08/12 1050) Rubella: 151.7 (08/12 1050) RPR: NON REAC (12/26 1401)  HBsAg: NEGATIVE (08/12 1050)  HIV: NON REACTIVE (12/26 1401)  GBS:   Not collected  Assessment and Plan  28 y/o [id number] here sent here from clinic with HTN and for evaluation for pre-eclampsia.  - Hypertensive to 168/84 since presentation  - Hx of previous pre-eclampsia and CS X2  - UA with 30 protein, pos nitrites, trace LE, many squams- Culture pending  - CBC, CMP WNL,  - Ur/Cre ratio elevated at 0.38  - Labs and HTN consistent with pre-eclampsia, will plan to take to OR today for CS, patient is 38 weeks and 0 days  - Will plan to admit today for CS   Kevin Fenton 08/05/2012, 12:59 PM   Agree with note above. Patient with pre-eclampsia at [redacted] weeks gestation, 2 previous cesarean sections, desires sterilization. She requests repeat CS, BTL. The procedure and the risk of anesthesia, bleeding , visceral organ damage, infection were discussed and her questions were answered. Will sign consent. NPO since 2000 yesterday.   ARNOLD,JAMES 08/05/2012 2:37 PM

## 2012-08-05 NOTE — Patient Instructions (Signed)
Mtodo para contar los movimientos fetales (Fetal Movement Counts) Nombre de la paciente: __________________________________________________ Franco Nones probable de parto:____________________ En los embarazos de alto riesgo se recomienda contar las pataditas, pero tambin es una buena idea que lo hagan todas las [place]. Comience a contarlas a las 28 semanas de embarazo. Los movimientos fetales aumentan luego de una comida Immunologist o de comer o beber algo dulce (el nivel de azcar en la sangre est ms alto). Tambin es importante beber gran cantidad de lquidos (hidratarse bien) antes de contar. Si se recuesta sobre el lado izquierdo mejorar la Designer, industrial/product, o puede sentarse en una silla cmoda con los brazos sobre el abdomen y sin distracciones que la rodeen. CONTANDO  Trate de contar a la [employer] lo haga.  Marque el da y la hora y vea cunto le lleva sentir 10 movimientos (patadas, agitaciones, sacudones, vueltas). Debe sentir al menos 10 movimientos en 2 horas. Probablemente sienta los 10 movimientos en menos de dos horas. Si no los siente, espere una hora y cuente nuevamente. Luego de [employer] tendr un patrn.  Debemos observar si hay cambios en el patrn o no hay suficientes pataditas en 2 horas. Le lleva ms tiempo contar los 10 movimientos? SOLICITE ATENCIN MDICA SI:  Siente menos de 10 pataditas en 2 horas. Intntelo dos veces.  No siente movimientos durante 1 hora.  El patrn se modifica o le lleva ms tiempo Art gallery manager las 10 pataditas.  Siente que el beb no se mueve como lo hace habitualmente. Fecha: ____________ Movimientos: ____________ Comienzo hora: ____________ Cephas Darby: ____________ Franco Nones: ____________ Movimientos: ____________ Comienzo hora: ____________ Cephas Darby: ____________ Franco Nones: ____________ Movimientos: ____________ Comienzo hora: ____________ Cephas Darby: ____________ Franco Nones: ____________ Movimientos: ____________ Comienzo hora:  ____________ Cephas Darby: ____________ Franco Nones: ____________ Movimientos: ____________ Comienzo hora: ____________ Cephas Darby: ____________ Franco Nones: ____________ Movimientos: ____________ Comienzo hora: ____________ Cephas Darby: ____________ Franco Nones: ____________ Movimientos: ____________ Comienzo hora: ____________ Cephas Darby: ____________  Franco Nones: ____________ Movimientos: ____________ Comienzo hora: ____________ Cephas Darby: ____________ Franco Nones: ____________ Movimientos: ____________ Comienzo hora: ____________ Cephas Darby: ____________ Franco Nones: ____________ Movimientos: ____________ Comienzo hora: ____________ Cephas Darby: ____________ Franco Nones: ____________ Movimientos: ____________ Comienzo hora: ____________ Cephas Darby: ____________ Franco Nones: ____________ Movimientos: ____________ Comienzo hora: ____________ Cephas Darby: ____________ Franco Nones: ____________ Movimientos: ____________ Comienzo hora: ____________ Cephas Darby: ____________ Franco Nones: ____________ Movimientos: ____________ Comienzo hora: ____________ Cephas Darby: ____________  Franco Nones: ____________ Movimientos: ____________ Comienzo hora: ____________ Cephas Darby: ____________ Franco Nones: ____________ Movimientos: ____________ Comienzo hora: ____________ Cephas Darby: ____________ Franco Nones: ____________ Movimientos: ____________ Comienzo hora: ____________ Cephas Darby: ____________ Franco Nones: ____________ Movimientos: ____________ Comienzo hora: ____________ Cephas Darby: ____________ Franco Nones: ____________ Movimientos: ____________ Comienzo hora: ____________ Cephas Darby: ____________ Franco Nones: ____________ Movimientos: ____________ Comienzo hora: ____________ Cephas Darby: ____________ Franco Nones: ____________ Movimientos: ____________ Comienzo hora: ____________ Cephas Darby: ____________  Franco Nones: ____________ Movimientos: ____________ Comienzo hora: ____________ Cephas Darby: ____________ Franco Nones: ____________ Movimientos: ____________ Comienzo hora: ____________ Cephas Darby: ____________ Franco Nones: ____________ Movimientos: ____________  Comienzo hora: ____________ Cephas Darby: ____________ Franco Nones: ____________ Movimientos: ____________ Comienzo hora: ____________ Cephas Darby: ____________ Franco Nones: ____________ Movimientos: ____________ Comienzo hora: ____________ Cephas Darby: ____________ Franco Nones: ____________ Movimientos: ____________ Comienzo hora: ____________ Cephas Darby: ____________ Franco Nones: ____________ Movimientos: ____________ Comienzo hora: ____________ Cephas Darby: ____________  Franco Nones: ____________ Movimientos: ____________ Comienzo hora: ____________ Cephas Darby: ____________ Franco Nones: ____________ Movimientos: ____________ Comienzo hora: ____________ Cephas Darby: ____________ Franco Nones: ____________ Movimientos: ____________ Comienzo hora: ____________ Cephas Darby: ____________ Franco Nones: ____________ Movimientos: ____________ Comienzo hora: ____________ Cephas Darby: ____________ Franco Nones: ____________ Movimientos: ____________ Comienzo hora: ____________ Fin hora: ____________  Fecha: ____________ Movimientos: ____________ Comienzo hora: ____________ Cephas Darby: ____________ Franco Nones: ____________ Movimientos: ____________ Comienzo hora: ____________ Cephas Darby: ____________  Franco Nones: ____________ Movimientos: ____________ Comienzo hora: ____________ Cephas Darby: ____________ Franco Nones: ____________ Movimientos: ____________ Comienzo hora: ____________ Cephas Darby: ____________ Franco Nones: ____________ Movimientos: ____________ Comienzo hora: ____________ Cephas Darby: ____________ Franco Nones: ____________ Movimientos: ____________ Comienzo hora: ____________ Cephas Darby: ____________ Franco Nones: ____________ Movimientos: ____________ Comienzo hora: ____________ Cephas Darby: ____________ Franco Nones: ____________ Movimientos: ____________ Comienzo hora: ____________ Cephas Darby: ____________ Franco Nones: ____________ Movimientos: ____________ Comienzo hora: ____________ Cephas Darby: ____________  Franco Nones: ____________ Movimientos: ____________ Comienzo hora: ____________ Cephas Darby: ____________ Franco Nones: ____________ Movimientos:  ____________ Comienzo hora: ____________ Cephas Darby: ____________ Franco Nones: ____________ Movimientos: ____________ Comienzo hora: ____________ Cephas Darby: ____________ Franco Nones: ____________ Movimientos: ____________ Comienzo hora: ____________ Cephas Darby: ____________ Franco Nones: ____________ Movimientos: ____________ Comienzo hora: ____________ Cephas Darby: ____________ Franco Nones: ____________ Movimientos: ____________ Comienzo hora: ____________ Cephas Darby: ____________ Franco Nones: ____________ Movimientos: ____________ Comienzo hora: ____________ Cephas Darby: ____________  Franco Nones: ____________ Movimientos: ____________ Comienzo hora: ____________ Cephas Darby: ____________ Franco Nones: ____________ Movimientos: ____________ Comienzo hora: ____________ Cephas Darby: ____________ Franco Nones: ____________ Movimientos: ____________ Comienzo hora: ____________ Cephas Darby: ____________ Franco Nones: ____________ Movimientos: ____________ Comienzo hora: ____________ Cephas Darby: ____________ Franco Nones: ____________ Movimientos: ____________ Comienzo hora: ____________ Cephas Darby: ____________ Franco Nones: ____________ Movimientos: ____________ Comienzo hora: ____________ Cephas Darby: ____________ Franco Nones: ____________ Movimientos: ____________ Comienzo hora: ____________ Fin hora: ____________  Document Released: 09/10/2007 Document Revised: 08/26/2011 Exit[employer] Patient Information 2013 [place], [place].

## 2012-08-05 NOTE — Anesthesia Procedure Notes (Signed)
Spinal  Patient location during procedure: OR Start time: 08/05/2012 5:08 PM Staffing Performed by: anesthesiologist  Preanesthetic Checklist Completed: patient identified, site marked, surgical consent, pre-op evaluation, timeout performed, IV checked, risks and benefits discussed and monitors and equipment checked Spinal Block Patient position: sitting Prep: site prepped and draped and DuraPrep Patient monitoring: heart rate, cardiac monitor, continuous pulse ox and blood pressure Approach: midline Location: L3-4 Injection technique: single-shot Needle Needle type: Sprotte  Needle gauge: 24 G Needle length: 9 cm Assessment Sensory level: T4 Additional Notes Clear free flow CSF on first attempt.  No paresthesia.  Patient tolerated procedure well with no apparent complications.  Jasmine December, MD

## 2012-08-05 NOTE — MAU Note (Addendum)
Patient is sent from the clinic due to elevated BP. She is scheduled repeat c-section. She c/o intermittent contractions. Patient denies headache, visual disturbance, vaginal bleeding or lof. She have 1+ bilateral lower extremity edema, 2+ reflexes and no clonus.

## 2012-08-05 NOTE — Progress Notes (Signed)
Rpt C/S and BTL scheduled 08/12/12. URI discussed. 3+ pretibial edema, abd edema. No H/A, visual disturbance. BP recheck 177/107. Did not get labs, 24 hr urine last wk. C/W Dr. Debroah Loop to MAU for further evaluation. Will keep NPO.

## 2012-08-05 NOTE — Anesthesia Postprocedure Evaluation (Signed)
  Anesthesia Post-op Note  Patient: Penny Spencer  Procedure(s) Performed: Procedure(s) with comments: CESAREAN SECTION (N/A) - Bilateral Tubal Ligation  Patient Location: PACU  Anesthesia Type:Spinal  Level of Consciousness: awake, alert  and oriented  Airway and Oxygen Therapy: Patient Spontanous Breathing  Post-op Pain: none  Post-op Assessment: Post-op Vital signs reviewed, Patient's Cardiovascular Status Stable, Respiratory Function Stable, Patent Airway, No signs of Nausea or vomiting, Pain level controlled, No headache, No backache, No residual numbness and No residual motor weakness  Post-op Vital Signs: Reviewed and stable  Complications: No apparent anesthesia complications

## 2012-08-05 NOTE — Transfer of Care (Signed)
Immediate Anesthesia Transfer of Care Note  Patient: Penny Spencer  Procedure(s) Performed: Procedure(s) with comments: CESAREAN SECTION (N/A) - Bilateral Tubal Ligation  Patient Location: PACU  Anesthesia Type:Spinal  Level of Consciousness: awake  Airway & Oxygen Therapy: Patient Spontanous Breathing  Post-op Assessment: Report given to PACU RN  Post vital signs: Reviewed and stable  Complications: No apparent anesthesia complications

## 2012-08-05 NOTE — Anesthesia Preprocedure Evaluation (Signed)
Anesthesia Evaluation  Patient identified by MRN, date of birth, ID band Patient awake    Reviewed: Allergy & Precautions, H&P , NPO status , Patient's Chart, lab work & pertinent test results, reviewed documented beta blocker date and time   History of Anesthesia Complications Negative for: history of anesthetic complications  Airway Mallampati: I TM Distance: >3 FB Neck ROM: full    Dental   Broken right lower molar:   Pulmonary neg pulmonary ROS,  breath sounds clear to auscultation        Cardiovascular hypertension (preeclampsia), Rhythm:regular Rate:Normal     Neuro/Psych negative neurological ROS  negative psych ROS   GI/Hepatic negative GI ROS, Neg liver ROS,   Endo/Other  obese  Renal/GU negative Renal ROS  negative genitourinary   Musculoskeletal   Abdominal   Peds  Hematology  (+) anemia ,   Anesthesia Other Findings   Reproductive/Obstetrics (+) Pregnancy (h/o c/s x2 (one in [place] and one at [facility]))                           Anesthesia Physical Anesthesia Plan  ASA: III  Anesthesia Plan: Spinal   Post-op Pain Management:    Induction:   Airway Management Planned:   Additional Equipment:   Intra-op Plan:   Post-operative Plan:   Informed Consent: I have reviewed the patients History and Physical, chart, labs and discussed the procedure including the risks, benefits and alternatives for the proposed anesthesia with the patient or authorized representative who has indicated his/her understanding and acceptance.     Plan Discussed with: Surgeon and CRNA  Anesthesia Plan Comments:         Anesthesia Quick Evaluation

## 2012-08-05 NOTE — Op Note (Signed)
Penny Spencer   PROCEDURE DATE: 08/05/2012   PREOPERATIVE DIAGNOSIS: Intrauterine pregnancy at [redacted]w[redacted]d gestation; prior cesaeran section x 2; Preeclampsia;  Undesired fertility  POSTOPERATIVE DIAGNOSIS: The same  PROCEDURE: Repeat Low Transverse Cesarean Section, Bilateral Tubal Sterilization using modified [place]   SURGEON:  Duane Lope, MD  ASSISTANT:  Napoleon Form, MD  INDICATIONS: Penny Spencer is a 27 y.o. [phone] presenting at [redacted]w[redacted]d presenting for repeat cesarean section and bilateral tubal sterilization secondary to the indications listed under preoperative diagnosis; please see preoperative note for further details.  The risks of surgery were discussed with the patient including but were not limited to: bleeding which may require transfusion or reoperation; infection which may require antibiotics; injury to bowel, bladder, ureters or other surrounding organs; injury to the fetus; need for additional procedures including hysterectomy in the event of a life-threatening hemorrhage; placental abnormalities wth subsequent pregnancies, incisional problems, thromboembolic phenomenon and other postoperative/anesthesia complications.  Patient also desires permanent sterilization.  Other reversible forms of contraception were discussed with patient; she declines all other modalities. Risks of procedure discussed with patient including but not limited to: risk of regret, permanence of method, bleeding, infection, injury to surrounding organs and need for additional procedures.  Failure risk of 0.5-1% with increased risk of ectopic gestation if pregnancy occurs was also discussed with patient.  The patient concurred with the proposed plan, giving informed written consent for the procedures.    FINDINGS:  Viable female infant in cephalic presentation.  Apgars 8 and 9.  Clear amniotic fluid.  Intact placenta, three vessel cord.  Normal uterus, fallopian tubes and ovaries bilaterally.  ANESTHESIA:  Spinal INTRAVENOUS FLUIDS: 1600 ml ESTIMATED BLOOD LOSS: 800 ml URINE OUTPUT:  225 ml SPECIMENS: Placenta sent to pathology COMPLICATIONS: None immediate  PROCEDURE IN DETAIL:  The patient preoperatively received intravenous antibiotics and had sequential compression devices applied to her lower extremities.   She was then taken to the operating room where spinal anesthesia was administered and was found to be adequate. She was then placed in a dorsal supine position with a leftward tilt, and prepped and draped in a sterile manner.  A foley catheter was placed into her bladder and attached to constant gravity.  After an adequate timeout was performed, a Pfannenstiel skin incision was made with scalpel and carried through to the underlying layer of fascia. The fascia was incised in the midline, and this incision was extended bilaterally using the Mayo scissors. Kocher clamps were applied to the superior aspect of the fascial incision and the underlying rectus muscles were dissected off bluntly. A similar process was carried out on the inferior aspect of the fascial incision. The rectus muscles were separated in the midline bluntly and the peritoneum was entered bluntly. Bladder blade was placed. Attention was turned to the lower uterine segment where a low transverse hysterotomy was made with a scalpel and extended bilaterally bluntly. The infant's head was delivered with vacuum assistance. Soft vacuum cup was positioned over the sagittal suture 3 cm anterior to posterior fontanelle.  Pressure was then increased to 500 mmHg. Gentle traction was maintained as fundal pressure was applied. One pop-off occurred. The infant was successfully delivered, the cord was clamped and cut and the infant was handed over to awaiting neonatology team. Uterine massage was then administered, and the placenta delivered intact with a three-vessel cord. The uterus was then exteriorized and cleared of clot and debris.  The  hysterotomy was closed with 0 Vicryl in a running locked  fashion.  Attention was then turned to the fallopian tubes.  The left tube was grasped with a Babcock clamp and followed to its fimbriated end. A knuckle of tube was elevated approximately 3 cm form the left cornu, ligated using #0 plain suture, and a 2-cm portion of tube excised. The cut edges of the fallopian tube were hemostatic. The same procedure was repeated on the right Fallopian tube. The uterus was returned to the pelvis, and the pelvis was cleared of all clot and debris. Hemostasis was confirmed on all surfaces.  The peritoneum and the muscles were reapproximated using 0 Vicryl interrupted stitches. The fascia was then closed using 0 Vicryl in a running fashion.  The subcutaneous layer was irrigated, and the skin was closed with a staples. The patient tolerated the procedure well. Sponge, lap, instrument and needle counts were correct x 2. The Foley catheter drained clear urine throughout the procedure.  The patient was taken to the recovery room in stable condition.   Napoleon Form, MD 08/05/2012 6:18 PM

## 2012-08-05 NOTE — H&P (Signed)
Penny Spencer is a 27 y.o. female sent here from clinic for HTN and evaluation for pre-eclampsia. PAtient is feeling well today and having irregular strong contraction for a few days,. She denies vaginal bleeding, LOF, and change in vaginal discharge (white). She denies Headache, scotoma, blurred vision adn RUQ pain. She has been swelling more than usual for the last 2 weeks. Her only complaint is leg/foot numnbess and tingling taht cause her legs to "give out" so that she has to sit down and connot walk about 3-4 times a day for 2 weeks. She's also having a burning sensation of her skin on her BL LE.  She denies fever, chills, dyspea, chest pain, dysuria, and constipation or changes in her bowel habits.  She had pre-eclampsia in a previous pregnancy and had a C-Section at that time. She is scheduled for a CS next week.  Marland Kitchen History OB History   Grav Para Term Preterm Abortions TAB SAB Ect Mult Living   4 2 2  1  1   2      Past Medical History  Diagnosis Date  . Pregnancy induced hypertension   . Preeclampsia 2007  . No pertinent past medical history    Past Surgical History  Procedure Laterality Date  . Cesarean section      x2  . No past surgeries     Family History: family history includes Diabetes in her father. Social History:  reports that she has never smoked. She has never used smokeless tobacco. She reports that she does not drink alcohol or use illicit drugs.   Prenatal Transfer Tool  Maternal Diabetes: No Genetic Screening: Declined Maternal Ultrasounds/Referrals: Normal Fetal Ultrasounds or other Referrals:  None Maternal Substance Abuse:  No Significant Maternal Medications:  None Significant Maternal Lab Results:  None Other Comments:  None  ROS Per HPI    Blood pressure 151/76, pulse 70, temperature 98.6 F (37 C), temperature source Oral, resp. rate 18, height 4\' 9"  (1.448 m), weight 81.194 kg (179 lb), SpO2 100.00%. Exam Physical Exam   Gen: NAD, alert,  cooperative with exam HEENT: NCAT, EOMI, PERRL CV: RRR, good S1/S2, no murmur Resp: CTABL, no wheezes, non-labored Abd: Soft, pregnant abdomen, no tenderness to palpation Ext: 1-2+ pitting edema of BL legs Neuro: Alert and oriented, strength 5/5 in BL LE, sensation in tact in BL LE  Prenatal labs: ABO, Rh: O/POS/-- (08/12 1050) Antibody: NEG (08/12 1050) Rubella: 151.7 (08/12 1050) RPR: NON REAC (12/26 1401)  HBsAg: NEGATIVE (08/12 1050)  HIV: NON REACTIVE (12/26 1401)  GBS:   not collected  Glucose screen:  1hr 139, 2 hour 167, 127   Results for orders placed during the hospital encounter of 08/05/12 (from the past 24 hour(s))  URINALYSIS, ROUTINE W REFLEX MICROSCOPIC     Status: Abnormal   Collection Time    08/05/12 11:06 AM      Result Value Range   Color, Urine YELLOW  YELLOW   APPearance CLOUDY (*) CLEAR   Specific Gravity, Urine 1.020  1.005 - 1.030   pH 6.0  5.0 - 8.0   Glucose, UA NEGATIVE  NEGATIVE mg/dL   Hgb urine dipstick NEGATIVE  NEGATIVE   Bilirubin Urine NEGATIVE  NEGATIVE   Ketones, ur NEGATIVE  NEGATIVE mg/dL   Protein, ur 30 (*) NEGATIVE mg/dL   Urobilinogen, UA 0.2  0.0 - 1.0 mg/dL   Nitrite POSITIVE (*) NEGATIVE   Leukocytes, UA TRACE (*) NEGATIVE  PROTEIN / CREATININE RATIO, URINE  Status: Abnormal   Collection Time    08/05/12 11:06 AM      Result Value Range   Creatinine, Urine 153.88     Total Protein, Urine 58     PROTEIN CREATININE RATIO 0.38 (*) 0.00 - 0.15  URINE MICROSCOPIC-ADD ON     Status: Abnormal   Collection Time    08/05/12 11:06 AM      Result Value Range   Squamous Epithelial / LPF MANY (*) RARE   WBC, UA 3-6  <3 WBC/hpf   Bacteria, UA FEW (*) RARE   Urine-Other MUCOUS PRESENT    CBC     Status: Abnormal   Collection Time    08/05/12 11:10 AM      Result Value Range   WBC 8.8  4.0 - 10.5 K/uL   RBC 3.91  3.87 - 5.11 MIL/uL   Hemoglobin 10.5 (*) 12.0 - 15.0 g/dL   HCT 40.9 (*) 81.1 - 91.4 %   MCV 83.1  78.0 - 100.0  fL   MCH 26.9  26.0 - 34.0 pg   MCHC 32.3  30.0 - 36.0 g/dL   RDW 78.2  95.6 - 21.3 %   Platelets 205  150 - 400 K/uL  COMPREHENSIVE METABOLIC PANEL     Status: Abnormal   Collection Time    08/05/12 11:10 AM      Result Value Range   Sodium 137  135 - 145 mEq/L   Potassium 4.1  3.5 - 5.1 mEq/L   Chloride 106  96 - 112 mEq/L   CO2 21  19 - 32 mEq/L   Glucose, Bld 83  70 - 99 mg/dL   BUN 11  6 - 23 mg/dL   Creatinine, Ser 0.86  0.50 - 1.10 mg/dL   Calcium 8.5  8.4 - 57.8 mg/dL   Total Protein 5.8 (*) 6.0 - 8.3 g/dL   Albumin 2.5 (*) 3.5 - 5.2 g/dL   AST 15  0 - 37 U/L   ALT 9  0 - 35 U/L   Alkaline Phosphatase 155 (*) 39 - 117 U/L   Total Bilirubin 0.3  0.3 - 1.2 mg/dL   GFR calc non Af Amer >90  >90 mL/min   GFR calc Af Amer >90  >90 mL/min     Assessment/Plan: 27 y/o [id number] here sent here from clinic with HTN and for evaluation for pre-eclampsia, after her workup it is clear that she currently has pre-eclampsia  - Hypertensive to 168/84 since presentation  - Hx of previous pre-eclampsia and CS X2  - UA with 30 protein, pos nitrites, trace LE, many squams - Culture pending  - CBC, CMP WNL,  - Ur/Cre ratio elevated at 0.38 - Labs and HTN consistent with pre-eclampsia, will plan to take to OR today for CS, patient is 38 weeks and 0days      Kevin Fenton 08/05/2012, 1:07 PM

## 2012-08-06 ENCOUNTER — Encounter (HOSPITAL_COMMUNITY): Payer: Self-pay | Admitting: Anesthesiology

## 2012-08-06 ENCOUNTER — Encounter (HOSPITAL_COMMUNITY): Payer: Self-pay | Admitting: Obstetrics & Gynecology

## 2012-08-06 LAB — CBC
MCHC: 32.3 g/dL (ref 30.0–36.0)
MCV: 83.1 fL (ref 78.0–100.0)
Platelets: 175 10*3/uL (ref 150–400)
RDW: 14.5 % (ref 11.5–15.5)
WBC: 10.1 10*3/uL (ref 4.0–10.5)

## 2012-08-06 LAB — URINE CULTURE: Culture: NO GROWTH

## 2012-08-06 NOTE — Progress Notes (Signed)
Subjective: Postpartum Day 1: Cesarean Delivery Patient reports tolerating PO and no problems voiding, pain controlled. No headache or vision changes.  Breastfeeding with no problems.    Objective: Vital signs in last 24 hours: Temp:  [98 F (36.7 C)-99.1 F (37.3 C)] 99.1 F (37.3 C) (02/20 0800) Pulse Rate:  [62-90] 78 (02/20 1000) Resp:  [14-23] 18 (02/20 0800) BP: (105-163)/(47-95) 105/47 mmHg (02/20 1100) SpO2:  [94 %-99 %] 95 % (02/20 1000) Weight:  [79.334 kg (174 lb 14.4 oz)] 79.334 kg (174 lb 14.4 oz) (02/19 2031)   Intake/Output Summary (Last 24 hours) at 08/06/12 1130 Last data filed at 08/06/12 5784  Gross per 24 hour  Intake   4256 ml  Output   2530 ml  Net   1726 ml    Physical Exam:  General: alert, cooperative and no distress Lochia: appropriate Uterine Fundus: firm Incision: Small amount old blood at bottom of dressing DVT Evaluation: No evidence of DVT seen on physical exam. Negative Homan's sign. No cords or calf tenderness. No significant calf/ankle edema.   Recent Labs  08/05/12 1110 08/06/12 0539  HGB 10.5* 9.4*  HCT 32.5* 29.1*    Assessment/Plan: Status post Cesarean section. Doing well postoperatively. Continue routine post-op care Anemia:  Will start FeSO4 Preeclampsia - On mag until 6 PM tonight. BP normal range since delivery (on low side). Good urine output. Anticipate transfer to floor tonight and discharge tomorrow or Saturday.  Napoleon Form 08/06/2012, 11:26 AM

## 2012-08-06 NOTE — Anesthesia Postprocedure Evaluation (Signed)
  Anesthesia Post-op Note  Patient: Penny Spencer  Procedure(s) Performed: Procedure(s) with comments: CESAREAN SECTION WITH BILATERAL TUBAL LIGATION (N/A) - Repeat  Patient Location: PACU and A-ICU  Anesthesia Type:Epidural  Level of Consciousness: awake and alert   Airway and Oxygen Therapy: Patient Spontanous Breathing  Post-op Pain: none  Post-op Assessment: Patient's Cardiovascular Status Stable, Respiratory Function Stable, Patent Airway, No signs of Nausea or vomiting, Adequate PO intake, Pain level controlled, No headache, No backache, No residual numbness and No residual motor weakness  Post-op Vital Signs: Reviewed and stable  Complications: No apparent anesthesia complications

## 2012-08-06 NOTE — Progress Notes (Signed)
Phone report called to D. Delton See, RN on [facility] re pt transfer to 134 - given complete assessment.  Transferred via WC in satis cond.  Acc by husband and infant in crib.

## 2012-08-06 NOTE — Progress Notes (Signed)
UR chart review completed.  

## 2012-08-07 ENCOUNTER — Encounter (HOSPITAL_COMMUNITY): Payer: Self-pay

## 2012-08-07 MED ORDER — AMLODIPINE BESYLATE 10 MG PO TABS
10.0000 mg | ORAL_TABLET | Freq: Every day | ORAL | Status: DC
  Administered 2012-08-07 – 2012-08-08 (×2): 10 mg via ORAL
  Filled 2012-08-07 (×3): qty 1

## 2012-08-07 MED ORDER — AMLODIPINE BESYLATE 10 MG PO TABS
10.0000 mg | ORAL_TABLET | Freq: Every day | ORAL | Status: DC
  Filled 2012-08-07: qty 1

## 2012-08-07 MED ORDER — HYDROCHLOROTHIAZIDE 25 MG PO TABS
25.0000 mg | ORAL_TABLET | Freq: Every day | ORAL | Status: DC
  Administered 2012-08-07 – 2012-08-08 (×2): 25 mg via ORAL
  Filled 2012-08-07 (×3): qty 1

## 2012-08-07 NOTE — Progress Notes (Signed)
Informed patient that infant should stay on photo lights continuously.  Found infant off of lights, pt states lab took the baby off and did not replace.

## 2012-08-07 NOTE — Progress Notes (Signed)
Subjective: Postpartum Day 2: Cesarean Delivery and BTL Patient reports incisional pain, tolerating PO, + flatus and no problems voiding.  Baby is doing well, but is receiving phototherapy. Denies any preeclampsia symptoms.  Objective: Vital signs in last 24 hours: Temp:  [97.4 F (36.3 C)-99.2 F (37.3 C)] 98.8 F (37.1 C) (02/21 0551) Pulse Rate:  [66-112] 66 (02/21 0551) Resp:  [18] 18 (02/21 0551) BP: (105-165)/(47-90) 165/90 mmHg (02/21 0551) SpO2:  [96 %-100 %] 96 % (02/21 0551)  Physical Exam:  General: alert and no distress Lochia: appropriate Uterine Fundus: firm Incision: no significant drainage, no significant erythema, covered with honeycomb dressing DVT Evaluation: No evidence of DVT seen on physical exam. Negative Homan's sign.   Recent Labs  08/05/12 1110 08/06/12 0539  HGB 10.5* 9.4*  HCT 32.5* 29.1*    Assessment/Plan: Status post Cesarean section. Doing well postoperatively.  Will start HCTZ for BP control, continue to monitor Discharge to home tomorrow if she remains stable  ANYANWU,UGONNA A 08/07/2012, 10:23 AM

## 2012-08-08 MED ORDER — OXYCODONE-ACETAMINOPHEN 5-325 MG PO TABS: 1.0000 | ORAL_TABLET | ORAL | Status: DC | PRN

## 2012-08-08 MED ORDER — AMLODIPINE BESYLATE 10 MG PO TABS: 10.0000 mg | ORAL_TABLET | Freq: Every day | ORAL | Status: DC

## 2012-08-08 MED ORDER — IBUPROFEN 600 MG PO TABS: 600.0000 mg | ORAL_TABLET | Freq: Four times a day (QID) | ORAL | Status: DC

## 2012-08-08 MED ORDER — FERROUS SULFATE 325 (65 FE) MG PO TABS: 325.0000 mg | ORAL_TABLET | Freq: Two times a day (BID) | ORAL | Status: DC

## 2012-08-08 MED ORDER — HYDROCHLOROTHIAZIDE 25 MG PO TABS: 25.0000 mg | ORAL_TABLET | Freq: Every day | ORAL | Status: DC

## 2012-08-08 NOTE — Discharge Summary (Signed)
Obstetric Discharge Summary Reason for Admission: Development of preeclampsia necessitating c-section @ 38.0 wks Prenatal Procedures: Preeclampsia Intrapartum Procedures: cesarean: low cervical, transverse and tubal ligation Postpartum Procedures: magnesium sulfate x 24 hrs Complications-Operative and Postpartum: none Hemoglobin  Date Value Range Status  08/06/2012 9.4* 12.0 - 15.0 g/dL Final     HCT  Date Value Range Status  08/06/2012 29.1* 36.0 - 46.0 % Final   Penny Spencer is a 27 y.o. female sent to [facility] from clinic for HTN and evaluation for pre-eclampsia. She denies Headache, scotoma, blurred vision adn RUQ pain. She has been swelling more than usual for the last 2 weeks. She had pre-eclampsia in a previous pregnancy. Her CBC/CMET were unremarkable at the time however the protein/creatine ratio was slightly elevated at 0.38. Her BPs were also 150-160/80s. The decision was made for her to have a repeat c/s and BTL this day.   Physical Exam:  General: alert, cooperative and mild distress Lungs: nl effort Heart: RRR Lochia: appropriate Uterine Fundus: firm Incision: healing well, no significant drainage DVT Evaluation: No evidence of DVT seen on physical exam.  Discharge Diagnoses: Term Pregnancy-delivered, Preelampsia and Tubal ligation  Discharge Information: Date: 08/08/2012 Activity: pelvic rest Diet: routine Medications: PNV, Ibuprofen, Iron, Percocet and HCTZ and Norvasc Condition: stable Instructions: refer to practice specific booklet Discharge to: home Follow-up Information   Follow up with [facility]. (Will need a BP check in 2 weeks (either in clinic or by [facility]) and a 6 week postpartum visit)    Contact information:   [address] [place] [address]       Newborn Data: Live born female  Birth Weight: 6 lb 11.6 oz (3050 g) APGAR: 8, 9  Home with mother. Breastfeeding going well.  Cam Hai 08/08/2012, 8:33 AM

## 2012-08-09 NOTE — Discharge Summary (Signed)
Attestation of Attending Supervision of Advanced Practitioner: Evaluation and management procedures were performed by the PA/NP/CNM/OB Fellow under my supervision/collaboration. Chart reviewed and agree with management and plan.  Ezzie Senat V 08/09/2012 10:52 PM   

## 2012-08-10 ENCOUNTER — Encounter: Payer: Self-pay | Admitting: *Deleted

## 2012-08-10 ENCOUNTER — Inpatient Hospital Stay (HOSPITAL_COMMUNITY): Admission: RE | Admit: 2012-08-10 | Payer: Self-pay | Source: Ambulatory Visit

## 2012-08-12 ENCOUNTER — Encounter (HOSPITAL_COMMUNITY): Admission: RE | Payer: Self-pay | Source: Ambulatory Visit

## 2012-08-12 ENCOUNTER — Inpatient Hospital Stay (HOSPITAL_COMMUNITY): Admission: RE | Admit: 2012-08-12 | Payer: Self-pay | Source: Ambulatory Visit | Admitting: Obstetrics & Gynecology

## 2012-08-12 SURGERY — Surgical Case
Anesthesia: Regional | Site: Abdomen

## 2012-09-07 ENCOUNTER — Ambulatory Visit: Payer: Self-pay | Admitting: Obstetrics and Gynecology

## 2012-12-31 ENCOUNTER — Encounter (HOSPITAL_COMMUNITY): Payer: Self-pay

## 2012-12-31 ENCOUNTER — Inpatient Hospital Stay (HOSPITAL_COMMUNITY)
Admission: EM | Admit: 2012-12-31 | Discharge: 2013-01-03 | DRG: 814 | Disposition: A | Discharge status: Home / Self Care | Payer: MEDICAID | Attending: General Surgery | Admitting: General Surgery

## 2012-12-31 ENCOUNTER — Emergency Department (HOSPITAL_COMMUNITY): Payer: Self-pay

## 2012-12-31 DIAGNOSIS — D72829 Elevated white blood cell count, unspecified: Principal | ICD-10-CM | POA: Diagnosis present

## 2012-12-31 DIAGNOSIS — R000 Tachycardia, unspecified: Secondary | ICD-10-CM | POA: Diagnosis present

## 2012-12-31 DIAGNOSIS — R109 Unspecified abdominal pain: Secondary | ICD-10-CM | POA: Diagnosis present

## 2012-12-31 DIAGNOSIS — D689 Coagulation defect, unspecified: Secondary | ICD-10-CM | POA: Diagnosis present

## 2012-12-31 DIAGNOSIS — O2432 Unspecified pre-existing diabetes mellitus in childbirth: Secondary | ICD-10-CM | POA: Diagnosis present

## 2012-12-31 DIAGNOSIS — E669 Obesity, unspecified: Secondary | ICD-10-CM | POA: Diagnosis present

## 2012-12-31 DIAGNOSIS — Z6830 Body mass index (BMI) 30.0-30.9, adult: Secondary | ICD-10-CM

## 2012-12-31 DIAGNOSIS — R6510 Systemic inflammatory response syndrome (SIRS) of non-infectious origin without acute organ dysfunction: Secondary | ICD-10-CM

## 2012-12-31 DIAGNOSIS — IMO0002 Reserved for concepts with insufficient information to code with codable children: Secondary | ICD-10-CM | POA: Diagnosis present

## 2012-12-31 DIAGNOSIS — O9912 Other diseases of the blood and blood-forming organs and certain disorders involving the immune mechanism complicating childbirth: Secondary | ICD-10-CM | POA: Diagnosis present

## 2012-12-31 DIAGNOSIS — O34219 Maternal care for unspecified type scar from previous cesarean delivery: Secondary | ICD-10-CM | POA: Diagnosis present

## 2012-12-31 DIAGNOSIS — E119 Type 2 diabetes mellitus without complications: Secondary | ICD-10-CM | POA: Diagnosis present

## 2012-12-31 HISTORY — DX: Unspecified abdominal pain: R10.9

## 2012-12-31 LAB — URINALYSIS, ROUTINE W REFLEX MICROSCOPIC
Ketones, ur: NEGATIVE mg/dL
Leukocytes, UA: NEGATIVE
Nitrite: NEGATIVE
Protein, ur: NEGATIVE mg/dL
Urobilinogen, UA: 1 mg/dL (ref 0.0–1.0)

## 2012-12-31 LAB — CBC WITH DIFFERENTIAL/PLATELET
Basophils Absolute: 0 10*3/uL (ref 0.0–0.1)
Basophils Relative: 0 % (ref 0–1)
Eosinophils Absolute: 0.3 10*3/uL (ref 0.0–0.7)
MCH: 28.5 pg (ref 26.0–34.0)
MCHC: 32.9 g/dL (ref 30.0–36.0)
Monocytes Relative: 6 % (ref 3–12)
Neutrophils Relative %: 76 % (ref 43–77)
Platelets: 257 10*3/uL (ref 150–400)
RDW: 13.8 % (ref 11.5–15.5)

## 2012-12-31 LAB — WET PREP, GENITAL
Trich, Wet Prep: NONE SEEN
Yeast Wet Prep HPF POC: NONE SEEN

## 2012-12-31 LAB — COMPREHENSIVE METABOLIC PANEL
Albumin: 3.7 g/dL (ref 3.5–5.2)
Alkaline Phosphatase: 116 U/L (ref 39–117)
BUN: 16 mg/dL (ref 6–23)
Potassium: 4 mEq/L (ref 3.5–5.1)
Sodium: 136 mEq/L (ref 135–145)
Total Protein: 7.6 g/dL (ref 6.0–8.3)

## 2012-12-31 MED ORDER — SODIUM CHLORIDE 0.9 % IV BOLUS (SEPSIS)
1000.0000 mL | Freq: Once | INTRAVENOUS | Status: AC
  Administered 2012-12-31: 1000 mL via INTRAVENOUS

## 2012-12-31 MED ORDER — MORPHINE SULFATE 4 MG/ML IJ SOLN
4.0000 mg | Freq: Once | INTRAMUSCULAR | Status: AC
  Administered 2012-12-31: 4 mg via INTRAVENOUS
  Filled 2012-12-31: qty 1

## 2012-12-31 MED ORDER — ONDANSETRON HCL 4 MG/2ML IJ SOLN
4.0000 mg | Freq: Once | INTRAMUSCULAR | Status: AC
  Administered 2012-12-31: 4 mg via INTRAVENOUS
  Filled 2012-12-31: qty 2
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
## 2012-12-31 NOTE — ED Notes (Signed)
Pt. Reports having fever and headache, denies any cold symptoms, denies any n/v or diarheea. Having lower abdominal pain pelvic area.

## 2012-12-31 NOTE — ED Notes (Signed)
Patient transported to CT 

## 2013-01-01 ENCOUNTER — Encounter (HOSPITAL_COMMUNITY): Payer: Self-pay | Admitting: Radiology

## 2013-01-01 ENCOUNTER — Emergency Department (HOSPITAL_COMMUNITY): Payer: Self-pay

## 2013-01-01 DIAGNOSIS — D72829 Elevated white blood cell count, unspecified: Secondary | ICD-10-CM

## 2013-01-01 DIAGNOSIS — R109 Unspecified abdominal pain: Secondary | ICD-10-CM

## 2013-01-01 LAB — CBC
Hemoglobin: 12.1 g/dL (ref 12.0–15.0)
Platelets: 241 10*3/uL (ref 150–400)
RBC: 4.13 MIL/uL (ref 3.87–5.11)
WBC: 14.1 10*3/uL — ABNORMAL HIGH (ref 4.0–10.5)

## 2013-01-01 LAB — CREATININE, SERUM
Creatinine, Ser: 0.48 mg/dL — ABNORMAL LOW (ref 0.50–1.10)
GFR calc Af Amer: >90 mL/min (ref >90)
GFR calc non Af Amer: >90 mL/min (ref >90)

## 2013-01-01 LAB — GC/CHLAMYDIA PROBE AMP: CT Probe RNA: NEGATIVE

## 2013-01-01 MED ORDER — ONDANSETRON HCL 4 MG/2ML IJ SOLN: 4.0000 mg | Freq: Four times a day (QID) | INTRAMUSCULAR | Status: DC | PRN

## 2013-01-01 MED ORDER — ACETAMINOPHEN 325 MG PO TABS
650.0000 mg | ORAL_TABLET | Freq: Four times a day (QID) | ORAL | Status: DC | PRN
  Administered 2013-01-01: 650 mg via ORAL
  Filled 2013-01-01: qty 2

## 2013-01-01 MED ORDER — ACETAMINOPHEN 650 MG RE SUPP: 650.0000 mg | Freq: Four times a day (QID) | RECTAL | Status: DC | PRN

## 2013-01-01 MED ORDER — SODIUM CHLORIDE 0.9 % IV SOLN
INTRAVENOUS | Status: DC
  Administered 2013-01-01 (×2): via INTRAVENOUS

## 2013-01-01 MED ORDER — IOHEXOL 300 MG/ML  SOLN
80.0000 mL | Freq: Once | INTRAMUSCULAR | Status: AC | PRN
  Administered 2013-01-01: 80 mL via INTRAVENOUS

## 2013-01-01 MED ORDER — HEPARIN SODIUM (PORCINE) 5000 UNIT/ML IJ SOLN
5000.0000 [IU] | Freq: Three times a day (TID) | INTRAMUSCULAR | Status: DC
  Administered 2013-01-01 – 2013-01-02 (×5): 5000 [IU] via SUBCUTANEOUS
  Filled 2013-01-01 (×10): qty 1

## 2013-01-01 MED ORDER — PIPERACILLIN-TAZOBACTAM 3.375 G IVPB
3.3750 g | Freq: Three times a day (TID) | INTRAVENOUS | Status: DC
  Administered 2013-01-01 – 2013-01-02 (×4): 3.375 g via INTRAVENOUS
  Filled 2013-01-01 (×6): qty 50

## 2013-01-01 MED ORDER — MORPHINE SULFATE 2 MG/ML IJ SOLN: 2.0000 mg | INTRAMUSCULAR | Status: DC | PRN

## 2013-01-01 MED ORDER — SODIUM CHLORIDE 0.9 % IV BOLUS (SEPSIS)
1000.0000 mL | Freq: Once | INTRAVENOUS | Status: AC
  Administered 2013-01-01: 1000 mL via INTRAVENOUS

## 2013-01-01 NOTE — ED Notes (Signed)
Patient transported to Ultrasound 

## 2013-01-01 NOTE — Progress Notes (Signed)
Mcc MD. Was contacted by RN on regard pt's medications and breast feeding.Keep monitoring pt. And assessing her needs.

## 2013-01-01 NOTE — ED Provider Notes (Addendum)
Patient signed out to be for reassessment and disposition by Dr. Dan Humphreys and PA Dahlia Client at change of shift. I re-xamined the patient at 1400. She continues to have lower abdominal pain which localizes to the RLQ. She says she has had subjective fever all day long, myalgias and headache. She is tachycardic at rest with pulse of 114, recheck temp is 98.7. On exam, the patient has RLQ ttp over McBurney's point.   PA Dahlia Client reports that the patient's pelvic exam was unremarkable and there was no CMT. Patient says that pelvic exam was non painful. WBC is 17K, no signs of UTI on U/A. CT abd/pelvis with IV contrast was essentially unremarkable although the appendix was not seen on the exam.  Concern, at this point, is for early appendicitis. I have paged [facility] to request bedside consultation.   1610:  Case discussed with Dr. Dwain Sarna who will see and evaluate the patient in the ED.   Brandt Loosen, MD 01/01/13 9604  0518:  Dr. Dwain Sarna evaluated the patient at approximately 0245. He recommended a pelvic ultrasound which was obtained and read as normal by the radiologist. No cause for rlq pain and SIRS identified. Dr. Dwain Sarna will admit for observation and serial exams.   Brandt Loosen, MD 01/01/13 [phone]

## 2013-01-01 NOTE — Progress Notes (Signed)
Patient ID: Penny Spencer, female   DOB: 1985-07-23, 27 y.o.   MRN: [id number]    Subjective: Abd gone now, denies n/v, feels much better today  Objective: Vital signs in last 24 hours: Temp:  [98.4 F (36.9 C)-99.8 F (37.7 C)] 99.5 F (37.5 C) (07/18 0630) Pulse Rate:  [80-108] 108 (07/18 0630) Resp:  [14-20] 20 (07/18 0630) BP: (97-120)/(38-76) 113/76 mmHg (07/18 0630) SpO2:  [94 %-99 %] 98 % (07/18 0630) Weight:  [160 lb 6.4 oz (72.757 kg)] 160 lb 6.4 oz (72.757 kg) (07/18 0630)    Intake/Output from previous day: 07/17 0701 - 07/18 0700 In: 3005 [I.V.:3005] Out: 0  Intake/Output this shift:    PE: Abd: soft, nontender, +BS General: Awake, NAD Heart: RRR Lungs: CTA bil  Lab Results:   Recent Labs  12/31/12 2014  WBC 17.3*  HGB 12.6  HCT 38.3  PLT 257   BMET  Recent Labs  12/31/12 2014  NA 136  K 4.0  CL 102  CO2 21  GLUCOSE 97  BUN 16  CREATININE 0.71  CALCIUM 9.0   PT/INR No results found for this basename: LABPROT, INR,  in the last 72 hours CMP     Component Value Date/Time   NA 136 12/31/2012 2014   K 4.0 12/31/2012 2014   CL 102 12/31/2012 2014   CO2 21 12/31/2012 2014   GLUCOSE 97 12/31/2012 2014   BUN 16 12/31/2012 2014   CREATININE 0.71 12/31/2012 2014   CREATININE 0.52 01/28/2012 0839   CREATININE 0.52 01/27/2012 1050   CALCIUM 9.0 12/31/2012 2014   PROT 7.6 12/31/2012 2014   ALBUMIN 3.7 12/31/2012 2014   AST 23 12/31/2012 2014   ALT 28 12/31/2012 2014   ALKPHOS 116 12/31/2012 2014   BILITOT 0.4 12/31/2012 2014   GFRNONAA >90 12/31/2012 2014   GFRAA >90 12/31/2012 2014   Lipase     Component Value Date/Time   LIPASE 15 01/01/2013 0506       Studies/Results: US Pelvis Complete  01/01/2013   *RADIOLOGY REPORT*  Clinical Data:  Right lower quadrant abdominal pain and leukocytosis.  TRANSABDOMINAL ULTRASOUND OF PELVIS DOPPLER ULTRASOUND OF OVARIES  Technique:  Transabdominal ultrasound examination of the pelvis was performed including  evaluation of the uterus, ovaries, adnexal regions, and pelvic cul-de-sac.  Color and duplex Doppler ultrasound was utilized to evaluate blood flow to the ovaries.  Comparison:  CT of the abdomen and pelvis performed earlier today at 12:09 a.m., and pelvic ultrasound performed 06/03/2012  Findings:  Uterus:  Normal in size and appearance; measures 8.3 x 3.3 x 4.1 cm.  Endometrium:  Not well defined on transabdominal imaging.  Right ovary:  Normal appearance/no adnexal mass; measures 4.0 x 2.2 x 2.5 cm.  Left ovary:  Normal appearance/no adnexal mass; measures 2.8 x 1.8 x 2.3 cm.  Pulsed Doppler evaluation demonstrates normal low-resistance arterial and venous waveforms in both ovaries.  No free fluid is seen within the pelvic cul-de-sac.  IMPRESSION: Normal exam.  No evidence of pelvis mass or other significant abnormality.  The endometrial echo complex is not well defined on transabdominal imaging.  No sonographic evidence for ovarian torsion.   Original Report Authenticated By: Tonia Ghent, M.D.   Ct Abdomen Pelvis W Contrast  01/01/2013   *RADIOLOGY REPORT*  Clinical Data: Pain with fever.  CT ABDOMEN AND PELVIS WITH CONTRAST  Technique:  Multidetector CT imaging of the abdomen and pelvis was performed following the standard protocol during bolus administration of  intravenous contrast.  Contrast: 80mL OMNIPAQUE IOHEXOL 300 MG/ML  SOLN  Comparison:  BODY WALL: Unremarkable.  LOWER CHEST:  Mediastinum: Unremarkable.  Lungs/pleura: No consolidation.  ABDOMEN/PELVIS:  Liver: No focal abnormality.  Biliary: No evidence of biliary obstruction or stone.  Pancreas: Unremarkable.  Spleen: Unremarkable.  Adrenals: Unremarkable.  Kidneys and ureters: No hydronephrosis or stone.  Bladder: Unremarkable.  Bowel: No obstruction. The appendix is not well resolved, there is no right lower quadrant stranding.  Retroperitoneum: No mass or adenopathy.  Peritoneum: No free fluid or gas.  Reproductive: Unremarkable.  Vascular: No  acute abnormality.  OSSEOUS: No acute abnormalities. No suspicious lytic or blastic lesions.  IMPRESSION: No evidence of acute intra-abdominal disease.   Original Report Authenticated By: Tiburcio Pea   [place] Art/ven Flow Abd Pelv Doppler  01/01/2013   *RADIOLOGY REPORT*  Clinical Data:  Right lower quadrant abdominal pain and leukocytosis.  TRANSABDOMINAL ULTRASOUND OF PELVIS DOPPLER ULTRASOUND OF OVARIES  Technique:  Transabdominal ultrasound examination of the pelvis was performed including evaluation of the uterus, ovaries, adnexal regions, and pelvic cul-de-sac.  Color and duplex Doppler ultrasound was utilized to evaluate blood flow to the ovaries.  Comparison:  CT of the abdomen and pelvis performed earlier today at 12:09 a.m., and pelvic ultrasound performed 06/03/2012  Findings:  Uterus:  Normal in size and appearance; measures 8.3 x 3.3 x 4.1 cm.  Endometrium:  Not well defined on transabdominal imaging.  Right ovary:  Normal appearance/no adnexal mass; measures 4.0 x 2.2 x 2.5 cm.  Left ovary:  Normal appearance/no adnexal mass; measures 2.8 x 1.8 x 2.3 cm.  Pulsed Doppler evaluation demonstrates normal low-resistance arterial and venous waveforms in both ovaries.  No free fluid is seen within the pelvic cul-de-sac.  IMPRESSION: Normal exam.  No evidence of pelvis mass or other significant abnormality.  The endometrial echo complex is not well defined on transabdominal imaging.  No sonographic evidence for ovarian torsion.   Original Report Authenticated By: Tonia Ghent, M.D.    Anti-infectives: Anti-infectives   Start     Dose/Rate Route Frequency Ordered Stop   01/01/13 0900  piperacillin-tazobactam (ZOSYN) IVPB 3.375 g     3.375 g 12.5 mL/hr over 240 Minutes Intravenous 3 times per day 01/01/13 2841         Assessment/Plan Abdominal pain/leukocytosis: CT and pelvic u/s showed no acute processes, appendix was not well visualized, will start zosyn today given the leukocytosis, WBC for  today pending, source of leukocytosis at this time is unknown   LOS: 1 day    Jhordan Mckibben 01/01/2013
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
## 2013-01-01 NOTE — ED Notes (Signed)
REPORTS GIVEN TO 3 WEST UNIT NURSE . DENIES PAIN ,RESPIRATIONS UNLABORED , IV SITE UNREMARKABLE .

## 2013-01-02 NOTE — Progress Notes (Signed)
Subjective: No pain, no nausea, no diarrhea.  Hungry. She speaks no [place] but her brother is there and he speaks [place]. Objective: Vital signs in last 24 hours: Temp:  [98.4 F (36.9 C)-98.8 F (37.1 C)] 98.6 F (37 C) (07/19 0951) Pulse Rate:  [71-89] 78 (07/19 0951) Resp:  [18-20] 18 (07/19 0951) BP: (103-140)/(52-77) 127/67 mmHg (07/19 0951) SpO2:  [98 %-100 %] 100 % (07/19 0951) Weight:  [69.809 kg (153 lb 14.4 oz)] 69.809 kg (153 lb 14.4 oz) (07/18 2116) Last BM Date: 12/31/12 Afebrile, VSS WBC is improving but still up UA was normal few clue cell on wet prep Intake/Output from previous day: 07/18 0701 - 07/19 0700 In: 1992.5 [I.V.:1792.5; IV Piggyback:200] Out: -  Intake/Output this shift:    General appearance: alert, cooperative and no distress Resp: clear to auscultation bilaterally GI: soft, non-tender; bowel sounds normal; no masses,  no organomegaly  Lab Results:   Recent Labs  12/31/12 2014 01/01/13 0825  WBC 17.3* 14.1*  HGB 12.6 12.1  HCT 38.3 35.9*  PLT 257 241    BMET  Recent Labs  12/31/12 2014 01/01/13 0825  NA 136  --   K 4.0  --   CL 102  --   CO2 21  --   GLUCOSE 97  --   BUN 16  --   CREATININE 0.71 0.48*  CALCIUM 9.0  --    PT/INR No results found for this basename: LABPROT, INR,  in the last 72 hours   Recent Labs Lab 12/31/12 2014  AST 23  ALT 28  ALKPHOS 116  BILITOT 0.4  PROT 7.6  ALBUMIN 3.7     Lipase     Component Value Date/Time   LIPASE 15 01/01/2013 0506     Studies/Results: US Pelvis Complete  01/01/2013   *RADIOLOGY REPORT*  Clinical Data:  Right lower quadrant abdominal pain and leukocytosis.  TRANSABDOMINAL ULTRASOUND OF PELVIS DOPPLER ULTRASOUND OF OVARIES  Technique:  Transabdominal ultrasound examination of the pelvis was performed including evaluation of the uterus, ovaries, adnexal regions, and pelvic cul-de-sac.  Color and duplex Doppler ultrasound was utilized to evaluate blood flow to  the ovaries.  Comparison:  CT of the abdomen and pelvis performed earlier today at 12:09 a.m., and pelvic ultrasound performed 06/03/2012  Findings:  Uterus:  Normal in size and appearance; measures 8.3 x 3.3 x 4.1 cm.  Endometrium:  Not well defined on transabdominal imaging.  Right ovary:  Normal appearance/no adnexal mass; measures 4.0 x 2.2 x 2.5 cm.  Left ovary:  Normal appearance/no adnexal mass; measures 2.8 x 1.8 x 2.3 cm.  Pulsed Doppler evaluation demonstrates normal low-resistance arterial and venous waveforms in both ovaries.  No free fluid is seen within the pelvic cul-de-sac.  IMPRESSION: Normal exam.  No evidence of pelvis mass or other significant abnormality.  The endometrial echo complex is not well defined on transabdominal imaging.  No sonographic evidence for ovarian torsion.   Original Report Authenticated By: Tonia Ghent, M.D.   Ct Abdomen Pelvis W Contrast  01/01/2013   *RADIOLOGY REPORT*  Clinical Data: Pain with fever.  CT ABDOMEN AND PELVIS WITH CONTRAST  Technique:  Multidetector CT imaging of the abdomen and pelvis was performed following the standard protocol during bolus administration of intravenous contrast.  Contrast: 80mL OMNIPAQUE IOHEXOL 300 MG/ML  SOLN  Comparison:  BODY WALL: Unremarkable.  LOWER CHEST:  Mediastinum: Unremarkable.  Lungs/pleura: No consolidation.  ABDOMEN/PELVIS:  Liver: No focal abnormality.  Biliary: No evidence  of biliary obstruction or stone.  Pancreas: Unremarkable.  Spleen: Unremarkable.  Adrenals: Unremarkable.  Kidneys and ureters: No hydronephrosis or stone.  Bladder: Unremarkable.  Bowel: No obstruction. The appendix is not well resolved, there is no right lower quadrant stranding.  Retroperitoneum: No mass or adenopathy.  Peritoneum: No free fluid or gas.  Reproductive: Unremarkable.  Vascular: No acute abnormality.  OSSEOUS: No acute abnormalities. No suspicious lytic or blastic lesions.  IMPRESSION: No evidence of acute intra-abdominal  disease.   Original Report Authenticated By: Tiburcio Pea   [place] Art/ven Flow Abd Pelv Doppler  01/01/2013   *RADIOLOGY REPORT*  Clinical Data:  Right lower quadrant abdominal pain and leukocytosis.  TRANSABDOMINAL ULTRASOUND OF PELVIS DOPPLER ULTRASOUND OF OVARIES  Technique:  Transabdominal ultrasound examination of the pelvis was performed including evaluation of the uterus, ovaries, adnexal regions, and pelvic cul-de-sac.  Color and duplex Doppler ultrasound was utilized to evaluate blood flow to the ovaries.  Comparison:  CT of the abdomen and pelvis performed earlier today at 12:09 a.m., and pelvic ultrasound performed 06/03/2012  Findings:  Uterus:  Normal in size and appearance; measures 8.3 x 3.3 x 4.1 cm.  Endometrium:  Not well defined on transabdominal imaging.  Right ovary:  Normal appearance/no adnexal mass; measures 4.0 x 2.2 x 2.5 cm.  Left ovary:  Normal appearance/no adnexal mass; measures 2.8 x 1.8 x 2.3 cm.  Pulsed Doppler evaluation demonstrates normal low-resistance arterial and venous waveforms in both ovaries.  No free fluid is seen within the pelvic cul-de-sac.  IMPRESSION: Normal exam.  No evidence of pelvis mass or other significant abnormality.  The endometrial echo complex is not well defined on transabdominal imaging.  No sonographic evidence for ovarian torsion.   Original Report Authenticated By: Tonia Ghent, M.D.    Medications: . heparin  5,000 Units Subcutaneous Q8H  . piperacillin-tazobactam (ZOSYN)  IV  3.375 g Intravenous Q8H    Assessment/Plan Abdominal pain/leukocytosis Pregnancy induced hypertension  Preeclampsia    Plan:  I'm not sure what caused her pain, it seems like it was LOWER  Abdomen adjacent to C-section, but more right than left side.  I will stop antibiotics, give her full liquids and see how she does, no one in family sick either. Recheck labs in AM.  LOS: 2 days    Callie Facey 01/02/2013

## 2013-01-02 NOTE — Progress Notes (Signed)
Denies n/v/abd pain  Alert, nad Soft, obese, nt  Agree with adv diet, stop abx and follow clinically  Mary Sella. Andrey Campanile, MD, FACS General, Bariatric, & Minimally Invasive Surgery [facility], [place]

## 2013-01-02 NOTE — Progress Notes (Signed)
Pts 17 month old daughter will not take a bottle. Called Pharmacist and current medications are ok for breastfeeding mother and daughter.

## 2013-01-03 ENCOUNTER — Encounter (HOSPITAL_COMMUNITY): Payer: Self-pay | Admitting: General Surgery

## 2013-01-03 DIAGNOSIS — R109 Unspecified abdominal pain: Secondary | ICD-10-CM | POA: Diagnosis present

## 2013-01-03 HISTORY — DX: Unspecified abdominal pain: R10.9

## 2013-01-03 LAB — COMPREHENSIVE METABOLIC PANEL
ALT: 21 U/L (ref 0–35)
AST: 14 U/L (ref 0–37)
Albumin: 3.7 g/dL (ref 3.5–5.2)
Alkaline Phosphatase: 94 U/L (ref 39–117)
Chloride: 101 mEq/L (ref 96–112)
Creatinine, Ser: 0.49 mg/dL — ABNORMAL LOW (ref 0.50–1.10)
Potassium: 3.8 mEq/L (ref 3.5–5.1)
Sodium: 139 mEq/L (ref 135–145)
Total Bilirubin: 0.2 mg/dL — ABNORMAL LOW (ref 0.3–1.2)

## 2013-01-03 LAB — CBC
Platelets: 301 10*3/uL (ref 150–400)
RBC: 4.55 MIL/uL (ref 3.87–5.11)
RDW: 13.3 % (ref 11.5–15.5)
WBC: 5.7 10*3/uL (ref 4.0–10.5)

## 2013-01-03 MED ORDER — ACETAMINOPHEN 325 MG PO TABS: 650.0000 mg | ORAL_TABLET | Freq: Four times a day (QID) | ORAL | Status: DC | PRN

## 2013-01-03 MED ORDER — FERROUS SULFATE 325 (65 FE) MG PO TABS: ORAL_TABLET | ORAL | Status: DC

## 2013-01-03 NOTE — Progress Notes (Addendum)
  Subjective: She feels fine, eating full liquids, no pain.    Objective: Vital signs in last 24 hours: Temp:  [97.6 F (36.4 C)-98.7 F (37.1 C)] 98.7 F (37.1 C) (07/20 0945) Pulse Rate:  [67-86] 77 (07/20 0945) Resp:  [18] 18 (07/20 0945) BP: (110-134)/(61-77) 110/69 mmHg (07/20 0945) SpO2:  [96 %-100 %] 98 % (07/20 0945) Weight:  [69.809 kg (153 lb 14.4 oz)] 69.809 kg (153 lb 14.4 oz) (07/19 2029) Last BM Date: 12/31/12 Nothing on I/O Afebrile, VSS, Labs are normal Intake/Output from previous day: 07/19 0701 - 07/20 0700 In: 120 [P.O.:120] Out: -  Intake/Output this shift:    General appearance: alert, cooperative and no distress Resp: clear to auscultation bilaterally GI: soft, non-tender; bowel sounds normal; no masses,  no organomegaly  Lab Results:   Recent Labs  01/01/13 0825 01/03/13 0530  WBC 14.1* 5.7  HGB 12.1 12.7  HCT 35.9* 39.0  PLT 241 301    BMET  Recent Labs  12/31/12 2014 01/01/13 0825 01/03/13 0530  NA 136  --  139  K 4.0  --  3.8  CL 102  --  101  CO2 21  --  24  GLUCOSE 97  --  83  BUN 16  --  15  CREATININE 0.71 0.48* 0.49*  CALCIUM 9.0  --  9.8   PT/INR No results found for this basename: LABPROT, INR,  in the last 72 hours   Recent Labs Lab 12/31/12 2014 01/03/13 0530  AST 23 14  ALT 28 21  ALKPHOS 116 94  BILITOT 0.4 0.2*  PROT 7.6 7.6  ALBUMIN 3.7 3.7     Lipase     Component Value Date/Time   LIPASE 15 01/01/2013 0506     Studies/Results: No results found.  Medications: . heparin  5,000 Units Subcutaneous Q8H    Assessment/Plan Abdominal pain/leukocytosis  Pregnancy induced hypertension  Preeclampsia    Plan:  Regular diet and home after lunch if she feel good.   1:45 PM, she got full liquids for lunch a regular tray is ordered but she really wants to go home.  Plan discharge now. Follow up with PCP, and our office if needed.  I do not know what was causing her abdominal pain and nausea.  LOS:  3 days    Galilee Pierron 01/03/2013 \

## 2013-01-03 NOTE — Discharge Summary (Signed)
Physician Discharge Summary  Patient ID: Penny Spencer MRN: [id number] DOB/AGE: [birth year] 27 y.o.  Admit date: 12/31/2012 Discharge date: 01/03/2013  Admission Diagnoses:  Lower abdominal pain and leukocytosis S/p C section 5 months ago.    Discharge Diagnoses:  Abdominal pain and leukocytosis of uncertain etiology Principal Problem:   Abdominal  pain, other specified site   PROCEDURES: None   Hospital Course:  42 yof with less than 24 hour history of lower abdominal pain. She has subjective fever, no emesis, having nl bms. She is five months postpartum and underwent a c/s at that time. She denies any urinary complaints, she does not know when LMP was. Pain was not getting better with OTC meds so she came to er. Palpation makes this worse. She has undergone ct scan which is inconclusive and I was asked to see her. She was admitted for observation, appendix was not well visualized and she was placed on Zosyn.  She felt better the following AM but we kept her NPO and watched.  She had no fever and no pain by 7/19, so we stopped the antibiotics, and gave her liquids.  She's had no pain and her WBC returned to normal.  She was discharged home in the afternoon 01/03/13.  She can return to our office as needed.  Condition on d/c:  Improved.   Disposition: 01-Home or Self Care     Medication List    ASK your doctor about these medications       amLODipine 10 MG tablet  Commonly known as:  NORVASC  Take 1 tablet (10 mg total) by mouth daily.     ferrous sulfate 325 (65 FE) MG tablet  Take 1 tablet (325 mg total) by mouth 2 (two) times daily with a meal.     hydrochlorothiazide 25 MG tablet  Commonly known as:  HYDRODIURIL  Take 1 tablet (25 mg total) by mouth daily.     ibuprofen 600 MG tablet  Commonly known as:  ADVIL,MOTRIN  Take 1 tablet (600 mg total) by mouth every 6 (six) hours.         SignedSherrie George 01/03/2013, 1:46 PM

## 2013-01-03 NOTE — Progress Notes (Signed)
Afebrile. Wbc down. Denies n/v/abd pain. Tolerating diet  Soft, nt, nd  Agree , if tolerates lunch, ok to d/c  Mary Sella. Andrey Campanile, MD, FACS General, Bariatric, & Minimally Invasive Surgery [facility], [place]

## 2013-01-04 NOTE — Progress Notes (Signed)
Pt seen and examined.  Afebrile. Wbc down.  Denies n/v/abd pain. Tolerating fulls. +flatus  Alert, nad, smiling Obese, soft, nt, nd.  Agree if tolerates lunch without difficulty ok for d/c Unsure of etiology of abd pain but she is AF, non tachy, improving wbc off of abx, no pain, tolerating diet.  Pt can f/u with PCP  Penny Spencer. Andrey Campanile, MD, FACS General, Bariatric, & Minimally Invasive Surgery [facility], [place]

## 2013-01-04 NOTE — Discharge Summary (Signed)
Penny Spencer M. Chaka Jefferys, MD, FACS General, Bariatric, & Minimally Invasive Surgery [facility]  

## 2013-01-07 ENCOUNTER — Encounter: Payer: Self-pay | Admitting: *Deleted

## 2013-02-11 NOTE — ED Provider Notes (Signed)
Medical screening examination/treatment/procedure(s) were conducted as a shared visit with non-physician practitioner(s) and myself.  I personally evaluated the patient during the encounter  Brandt Loosen, MD 02/11/13 [phone]

## 2014-03-15 ENCOUNTER — Ambulatory Visit: Payer: Medicaid Other

## 2014-04-18 ENCOUNTER — Encounter (HOSPITAL_COMMUNITY): Payer: Self-pay | Admitting: General Surgery

## 2014-04-18 IMAGING — US US PELVIS COMPLETE
1 series · 14 of 25 positions shown · non-contrast
Comparison: CT of the abdomen and pelvis performed earlier today
at [DATE] a.m., and pelvic ultrasound performed 06/03/2012

CLINICAL DATA: Right lower quadrant abdominal pain and
leukocytosis.

TRANSABDOMINAL ULTRASOUND OF PELVIS
DOPPLER ULTRASOUND OF OVARIES
TECHNIQUE: Transabdominal ultrasound examination of the pelvis was
performed including evaluation of the uterus, ovaries, adnexal
regions, and pelvic cul-de-sac.
Color and duplex Doppler ultrasound was utilized to evaluate blood
flow to the ovaries.

[Series 1: us pelvis complete · 0.26mm/px · 14 of 46 slices shown]
[im 1/46]
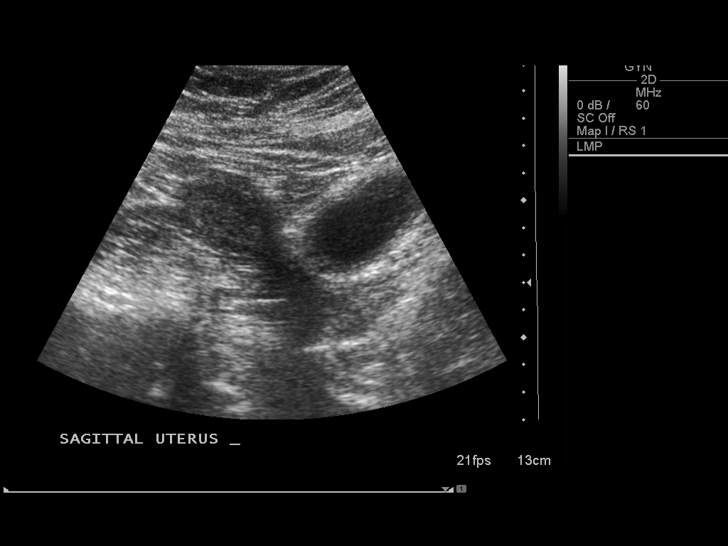
[im 4/46]
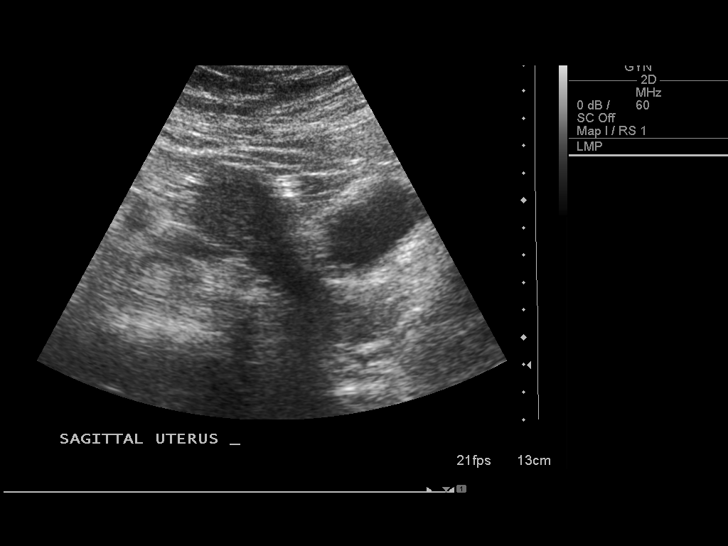
[im 8/46]
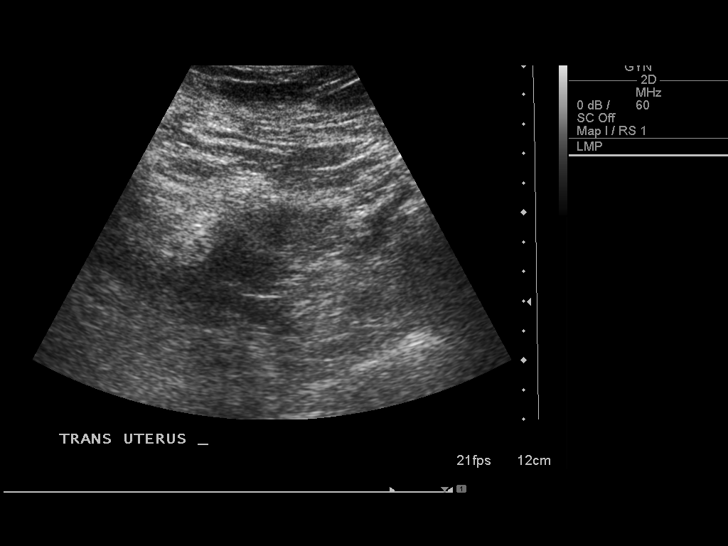
[im 12/46]
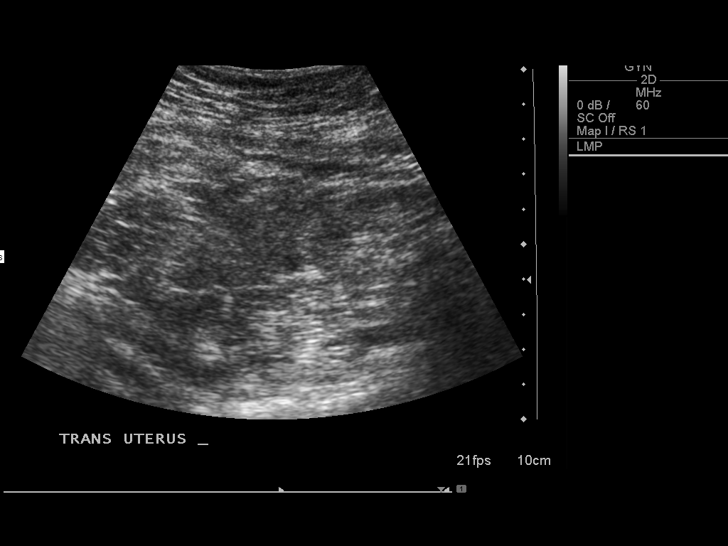
[im 16/46]
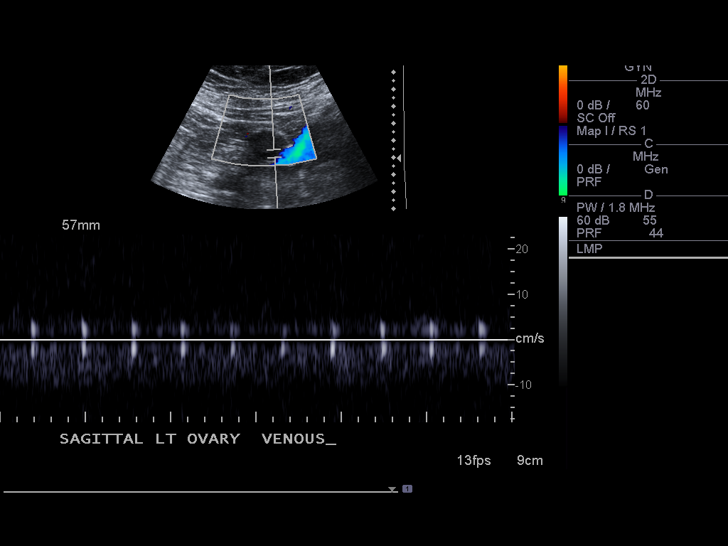
[im 17/46]
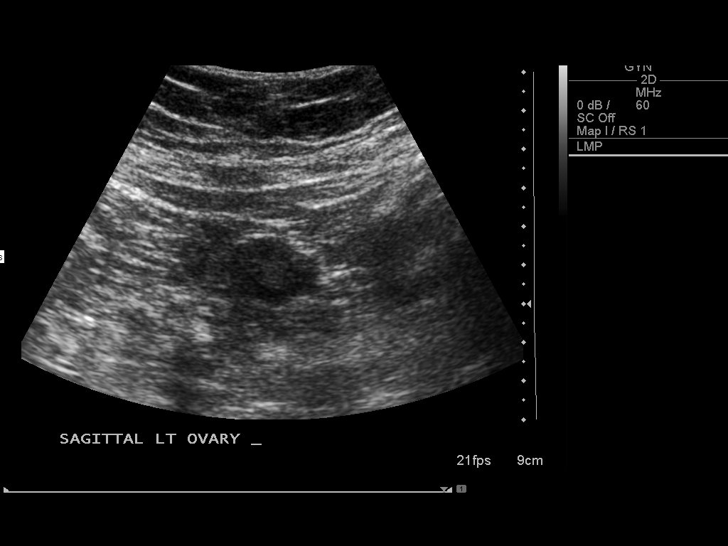
[im 21/46]
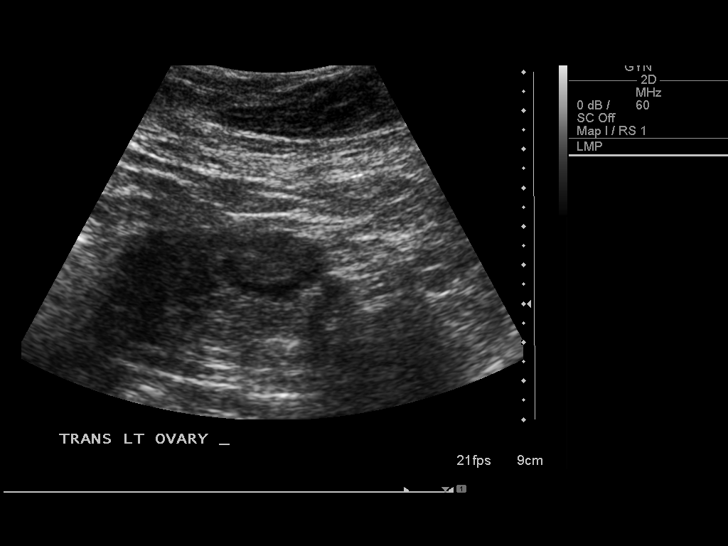
[im 25/46]
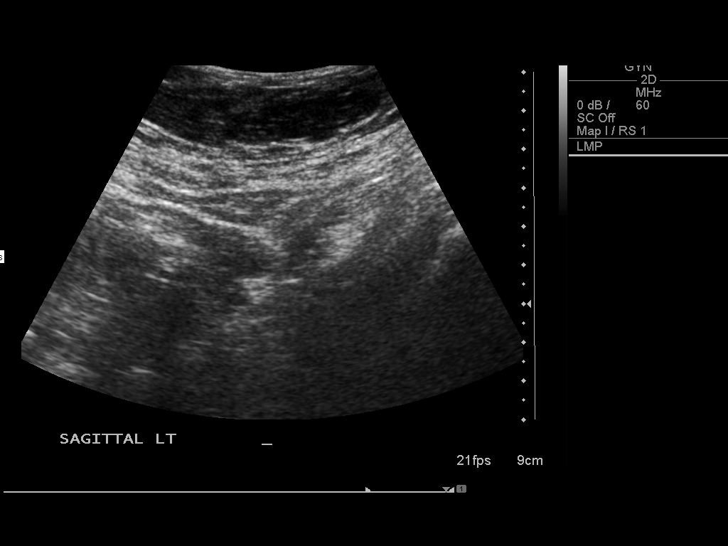
[im 29/46]
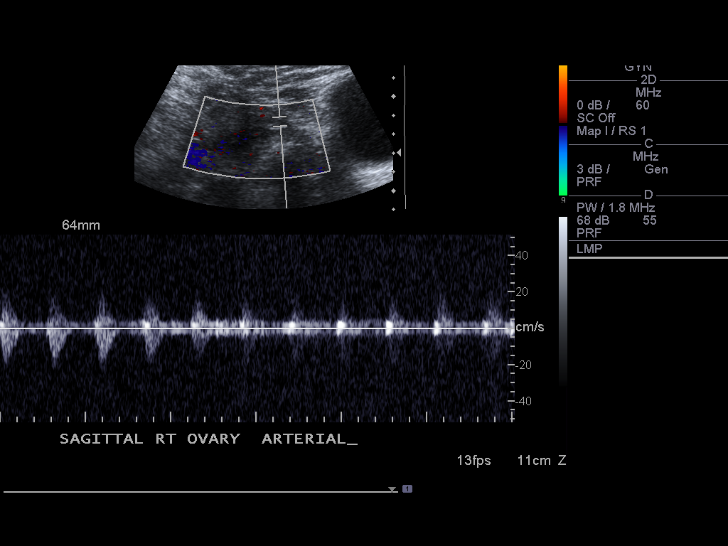
[im 31/46]
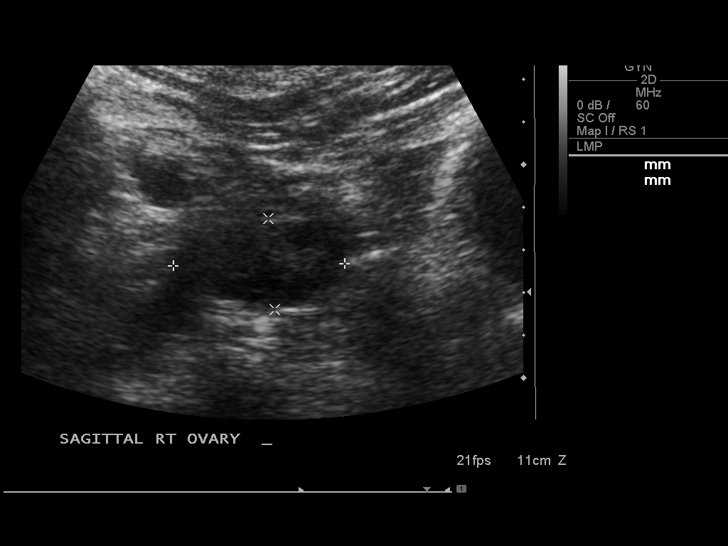
[im 34/46]
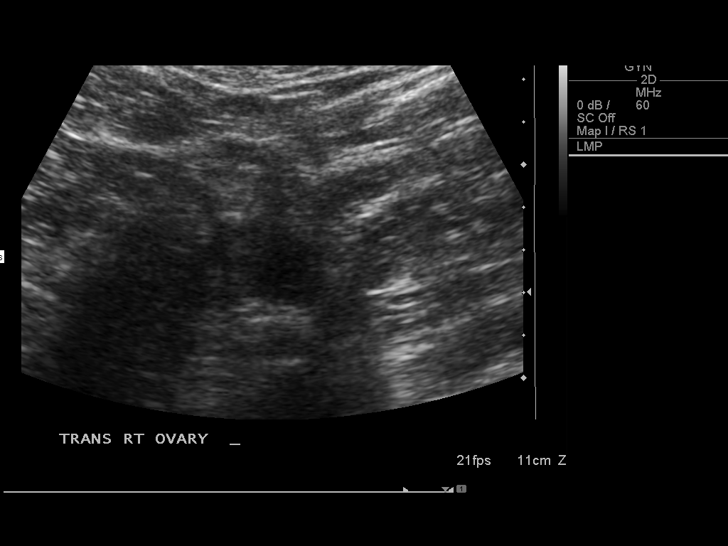
[im 38/46]
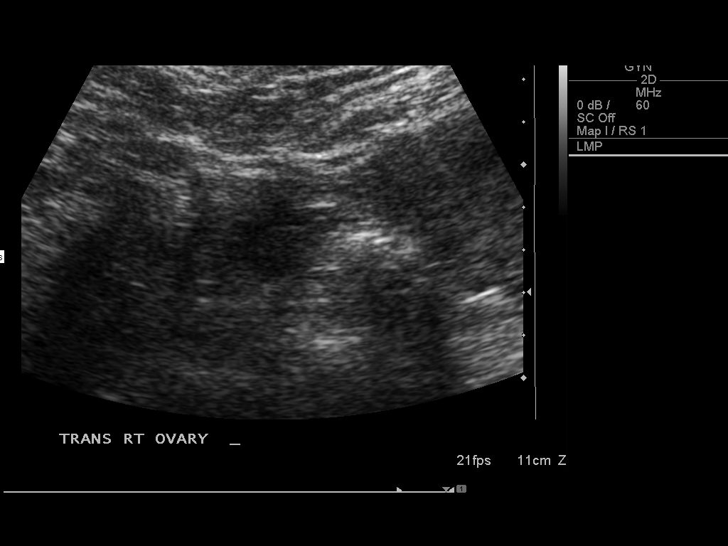
[im 42/46]
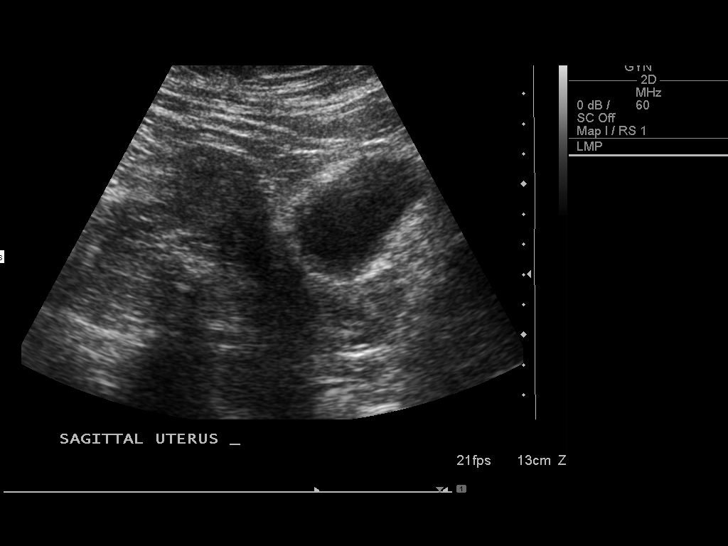
[im 46/46]
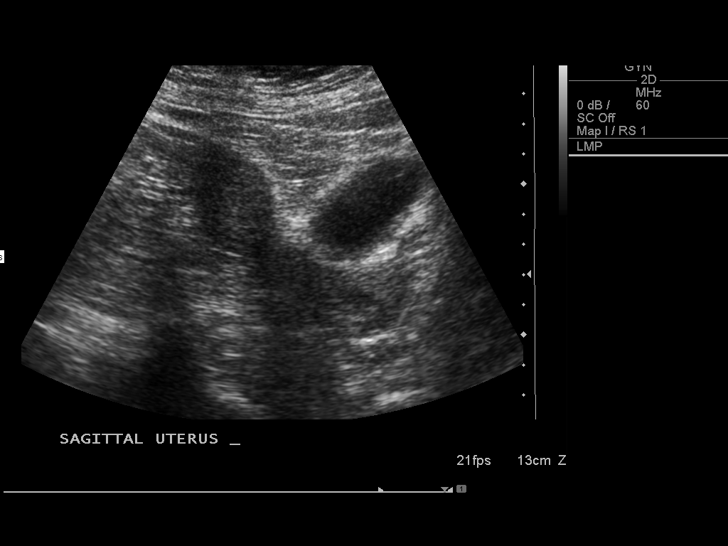

[14 of 25 positions shown; findings below may reference images not displayed]

FINDINGS: Uterus:  Normal in size and appearance; measures 8.3 x 3.3 x
cm.

Endometrium:  Not well defined on transabdominal imaging.

Right ovary:  Normal appearance/no adnexal mass; measures 4.0 x
x 2.5 cm.

Left ovary:  Normal appearance/no adnexal mass; measures 2.8 x
x 2.3 cm.

Pulsed Doppler evaluation demonstrates normal low-resistance
arterial and venous waveforms in both ovaries.

No free fluid is seen within the pelvic cul-de-sac.
IMPRESSION: Normal exam.  No evidence of pelvis mass or other significant
abnormality.  The endometrial echo complex is not well defined on
transabdominal imaging.

No sonographic evidence for ovarian torsion.

## 2014-04-18 IMAGING — CT CT ABD-PELV W/ CM
2 of 4 series · 16 of 46 positions shown, 18 images · IV contrast (APPLIED)
Comparison: BODY WALL: Unremarkable.

CLINICAL DATA: Pain with fever.

CT ABDOMEN AND PELVIS WITH CONTRAST
TECHNIQUE: Multidetector CT imaging of the abdomen and pelvis was
performed following the standard protocol during bolus
administration of intravenous contrast.
Contrast: 80mL OMNIPAQUE IOHEXOL 300 MG/ML  SOLN

[Series 2: abd/ pelvis 5.0 i30f 1 · axial · 0.77mm/px · z∈[-359,+71]mm · 13 of 94 slices shown, 15 images]
[im 4/94  soft-tissue]
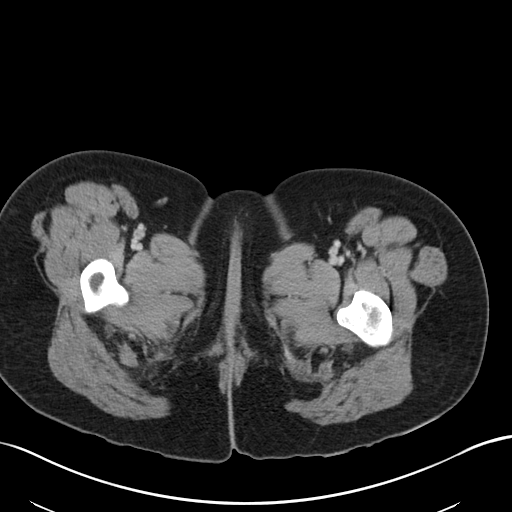
[im 4/94  bone]
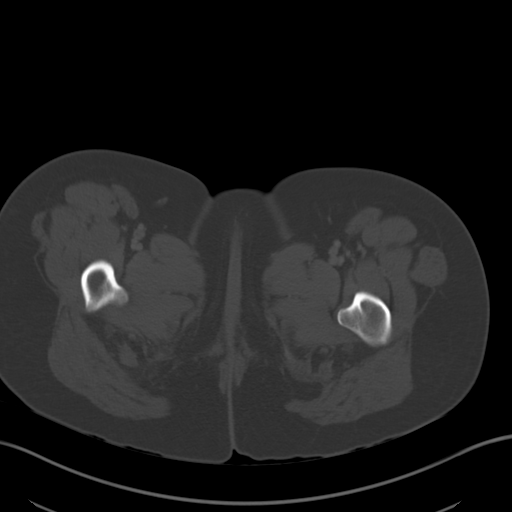
[im 12/94  soft-tissue]
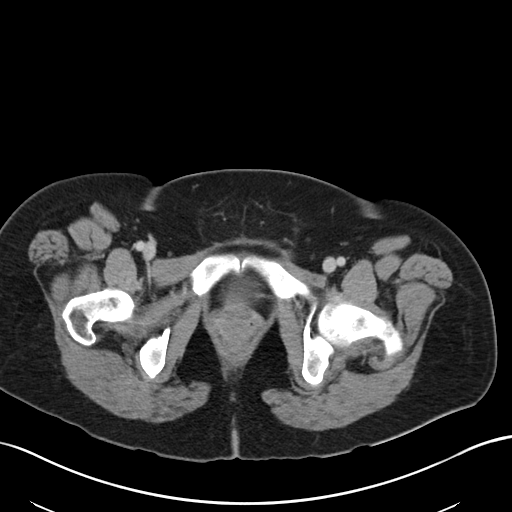
[im 19/94  soft-tissue]
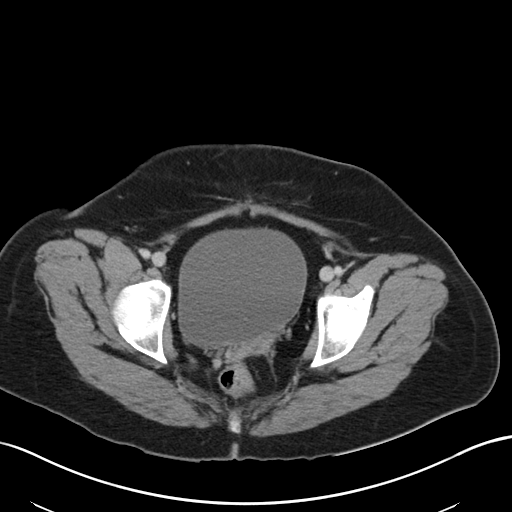
[im 27/94  soft-tissue]
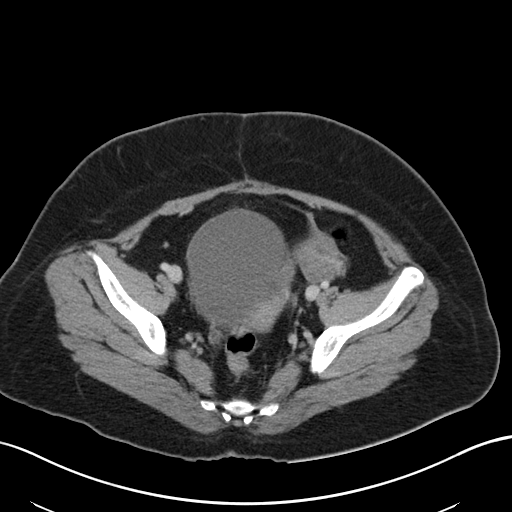
[im 34/94  soft-tissue]
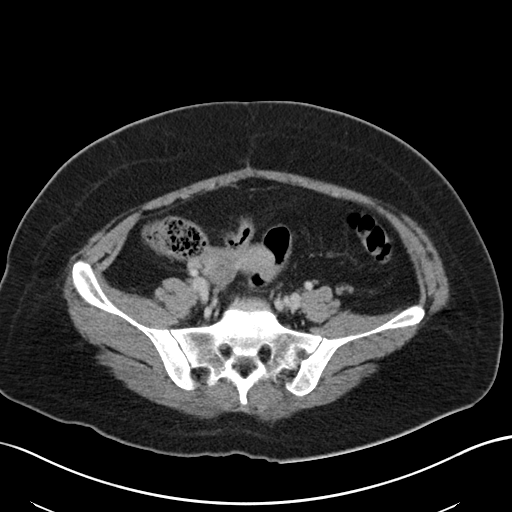
[im 41/94  soft-tissue]
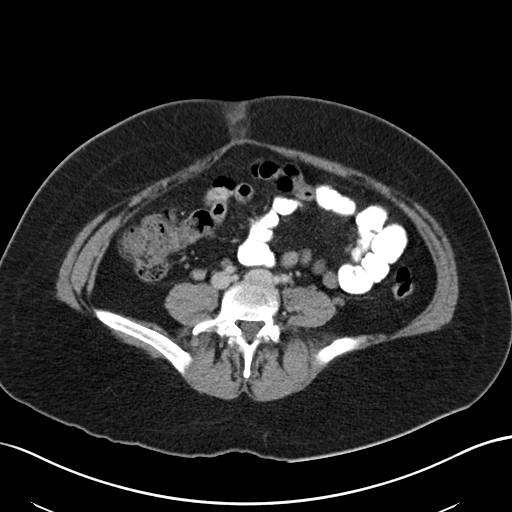
[im 49/94  soft-tissue]
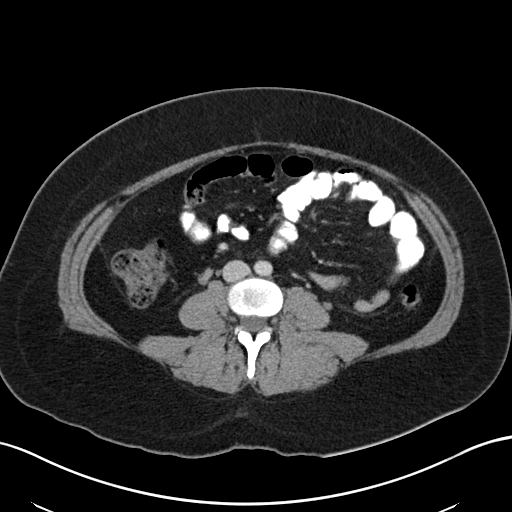
[im 53/94  soft-tissue]
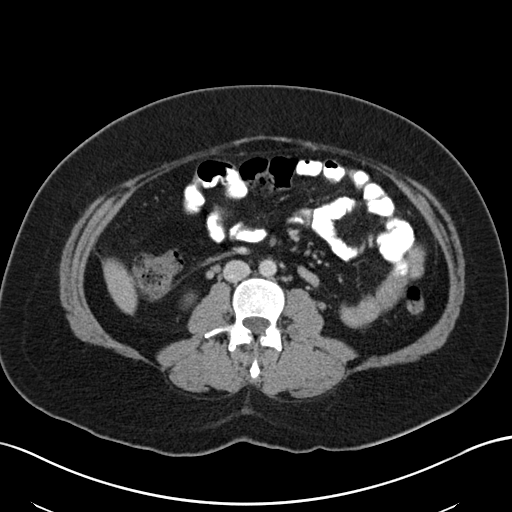
[im 60/94  soft-tissue]
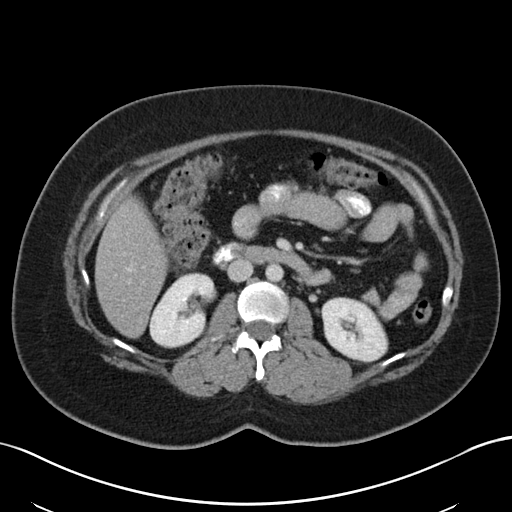
[im 60/94  bone]
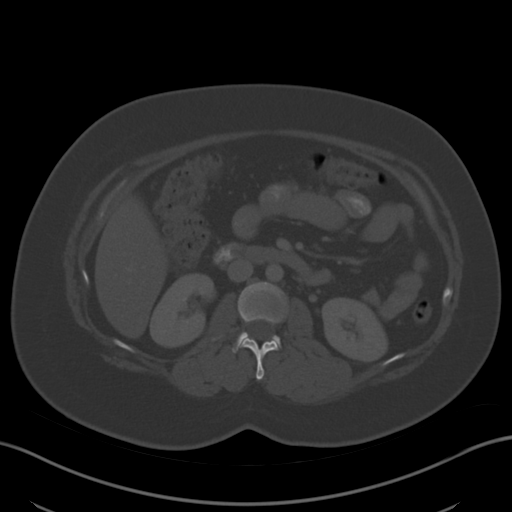
[im 67/94  soft-tissue]
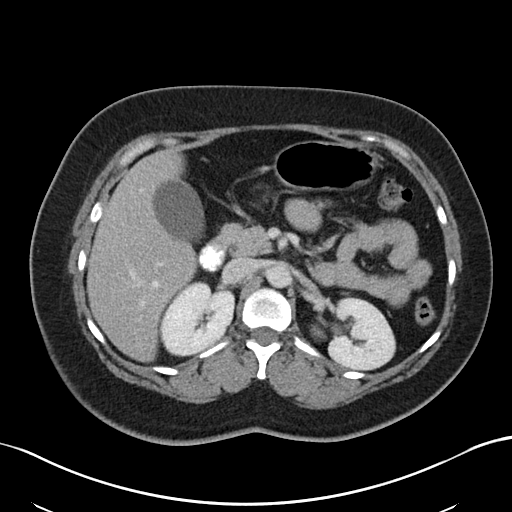
[im 75/94  soft-tissue]
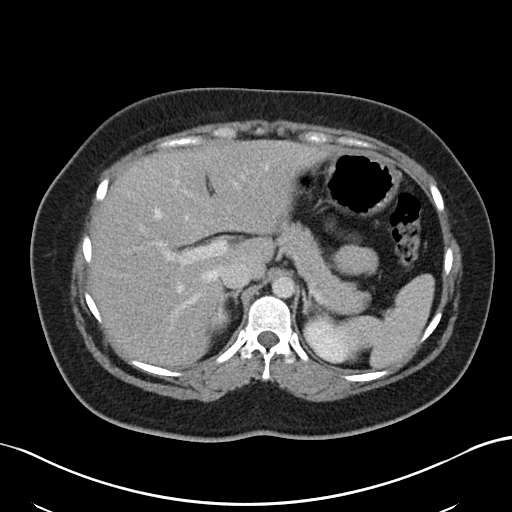
[im 82/94  soft-tissue]
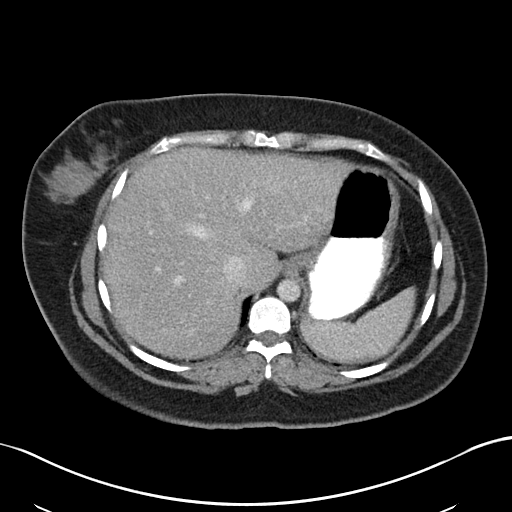
[im 90/94  soft-tissue]
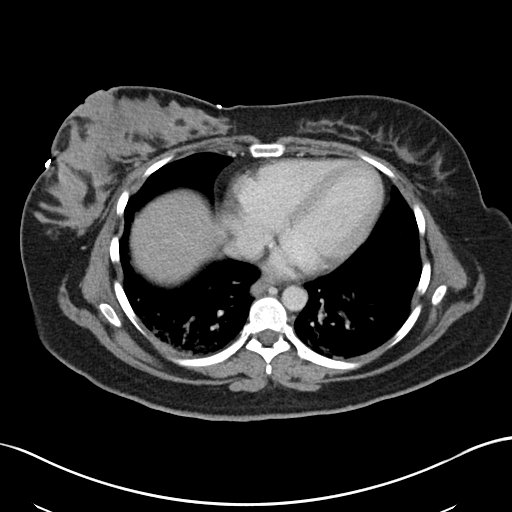

[Series 5: cororal soft tissue · coronal · 0.91mm/px · 3 of 88 slices shown]
[im 30/88  soft-tissue]
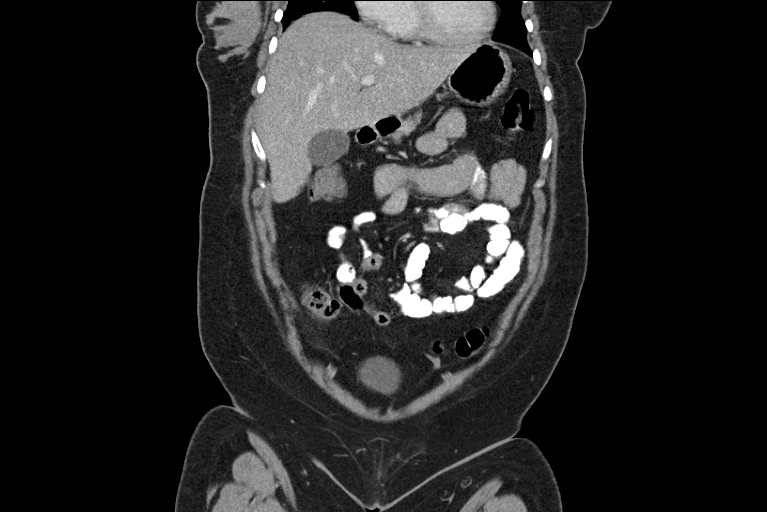
[im 39/88  soft-tissue]
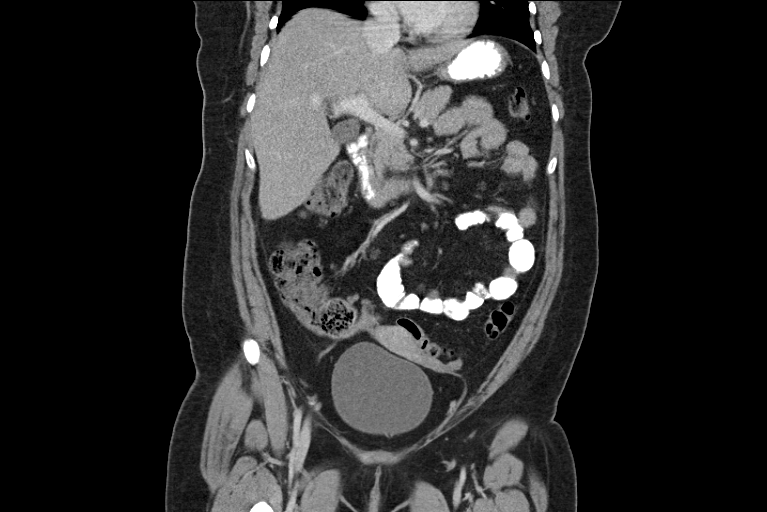
[im 49/88  soft-tissue]
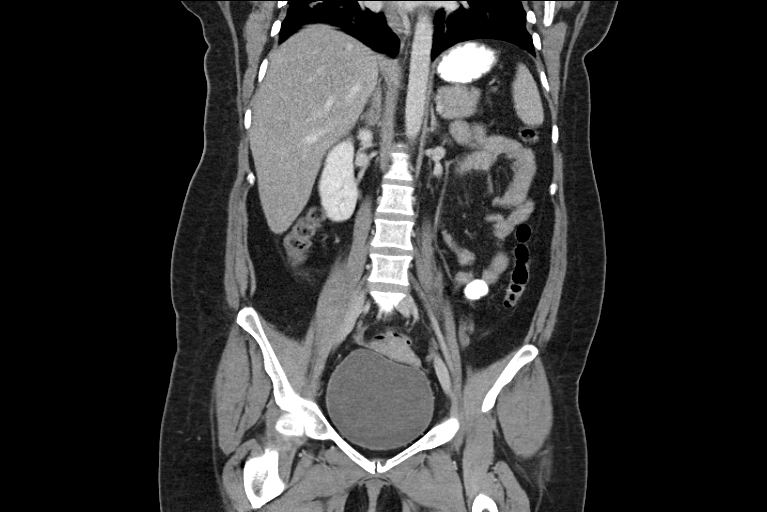

[16 of 46 positions shown; findings below may reference images not displayed]

LOWER CHEST:

Mediastinum: Unremarkable.

Lungs/pleura: No consolidation.

ABDOMEN/PELVIS:

Liver: No focal abnormality.

Biliary: No evidence of biliary obstruction or stone.

Pancreas: Unremarkable.

Spleen: Unremarkable.

Adrenals: Unremarkable.

Kidneys and ureters: No hydronephrosis or stone.

Bladder: Unremarkable.

Bowel: No obstruction. The appendix is not well resolved, there is
no right lower quadrant stranding.

Retroperitoneum: No mass or adenopathy.

Peritoneum: No free fluid or gas.

Reproductive: Unremarkable.

Vascular: No acute abnormality.

OSSEOUS: No acute abnormalities. No suspicious lytic or blastic
lesions.
IMPRESSION: No evidence of acute intra-abdominal disease.

## 2017-01-15 ENCOUNTER — Emergency Department (HOSPITAL_COMMUNITY)
Admission: EM | Admit: 2017-01-15 | Discharge: 2017-01-15 | Disposition: A | Discharge status: Home / Self Care | Payer: Self-pay | Attending: Emergency Medicine | Admitting: Emergency Medicine

## 2017-01-15 ENCOUNTER — Encounter (HOSPITAL_COMMUNITY): Payer: Self-pay | Admitting: *Deleted

## 2017-01-15 DIAGNOSIS — Z79899 Other long term (current) drug therapy: Secondary | ICD-10-CM | POA: Insufficient documentation

## 2017-01-15 DIAGNOSIS — J029 Acute pharyngitis, unspecified: Secondary | ICD-10-CM | POA: Insufficient documentation

## 2017-01-15 DIAGNOSIS — H9202 Otalgia, left ear: Secondary | ICD-10-CM | POA: Insufficient documentation

## 2017-01-15 LAB — RAPID STREP SCREEN (MED CTR MEBANE ONLY): STREPTOCOCCUS, GROUP A SCREEN (DIRECT): NEGATIVE

## 2017-01-15 LAB — POC URINE PREG, ED: Preg Test, Ur: NEGATIVE

## 2017-01-15 MED ORDER — IBUPROFEN 400 MG PO TABS
600.0000 mg | ORAL_TABLET | Freq: Once | ORAL | Status: AC
  Administered 2017-01-15: 600 mg via ORAL
  Filled 2017-01-15: qty 1

## 2017-01-15 MED ORDER — CLINDAMYCIN HCL 300 MG PO CAPS: 300.0000 mg | ORAL_CAPSULE | Freq: Three times a day (TID) | ORAL | 0 refills | Status: DC
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
## 2017-01-15 NOTE — Discharge Instructions (Signed)
Take Tylenol or Advil for pain. Take the antibiotic as prescribed. Call Dr.Wolicki's office to schedule next available appointment if not feeling better by next week. You can also call the numbers on these discharge instructions to get a primary care physician .Return if you feel worse for any reason. You will likely have sore throat for the next few days

## 2017-01-15 NOTE — ED Triage Notes (Signed)
Pt reports onset Sunday of left ear pain, has spread to her entire left side of face and now into her throat. Feels like throat is swelling and difficulty swallowing. Denies fever. Denies dental pain. Swelling noted to face but airway is intact and able to speak in full sentences.

## 2017-01-15 NOTE — ED Notes (Signed)
ED Provider at bedside. 

## 2017-01-17 LAB — CULTURE, GROUP A STREP (THRC)

## 2020-06-17 DIAGNOSIS — R7303 Prediabetes: Secondary | ICD-10-CM

## 2020-06-17 DIAGNOSIS — E785 Hyperlipidemia, unspecified: Secondary | ICD-10-CM

## 2020-06-17 HISTORY — DX: Hyperlipidemia, unspecified: E78.5

## 2020-06-17 HISTORY — DX: Prediabetes: R73.03

## 2020-07-07 ENCOUNTER — Other Ambulatory Visit: Payer: Self-pay

## 2020-07-07 ENCOUNTER — Ambulatory Visit: Payer: Self-pay | Admitting: Internal Medicine

## 2020-07-07 ENCOUNTER — Encounter: Payer: Self-pay | Admitting: Internal Medicine

## 2020-07-07 VITALS — BP 126/84 | HR 81 | Resp 12 | Ht <= 58 in | Wt 196.5 lb

## 2020-07-07 DIAGNOSIS — Z6841 Body Mass Index (BMI) 40.0 and over, adult: Secondary | ICD-10-CM

## 2020-07-07 DIAGNOSIS — L83 Acanthosis nigricans: Secondary | ICD-10-CM

## 2020-07-07 NOTE — Patient Instructions (Signed)
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
## 2020-07-08 LAB — CBC WITH DIFFERENTIAL/PLATELET
Basophils Absolute: 0.1 10*3/uL (ref 0.0–0.2)
Basos: 1 %
EOS (ABSOLUTE): 0.2 10*3/uL (ref 0.0–0.4)
Eos: 3 %
Hematocrit: 46.8 % — ABNORMAL HIGH (ref 34.0–46.6)
Hemoglobin: 15.4 g/dL (ref 11.1–15.9)
Immature Grans (Abs): 0 10*3/uL (ref 0.0–0.1)
Immature Granulocytes: 0 %
Lymphocytes Absolute: 2.9 10*3/uL (ref 0.7–3.1)
Lymphs: 40 %
MCH: 28.6 pg (ref 26.6–33.0)
MCHC: 32.9 g/dL (ref 31.5–35.7)
MCV: 87 fL (ref 79–97)
Monocytes Absolute: 0.4 10*3/uL (ref 0.1–0.9)
Monocytes: 6 %
Neutrophils Absolute: 3.7 10*3/uL (ref 1.4–7.0)
Neutrophils: 50 %
Platelets: 353 10*3/uL (ref 150–450)
RBC: 5.39 x10E6/uL — ABNORMAL HIGH (ref 3.77–5.28)
RDW: 12.7 % (ref 11.7–15.4)
WBC: 7.3 10*3/uL (ref 3.4–10.8)

## 2020-07-08 LAB — COMPREHENSIVE METABOLIC PANEL
ALT: 94 IU/L — ABNORMAL HIGH (ref 0–32)
AST: 50 IU/L — ABNORMAL HIGH (ref 0–40)
Albumin/Globulin Ratio: 1.5 (ref 1.2–2.2)
Albumin: 4.8 g/dL (ref 3.8–4.8)
Alkaline Phosphatase: 91 IU/L (ref 44–121)
BUN/Creatinine Ratio: 20 (ref 9–23)
BUN: 13 mg/dL (ref 6–20)
Bilirubin Total: 0.6 mg/dL (ref 0.0–1.2)
CO2: 23 mmol/L (ref 20–29)
Calcium: 9.6 mg/dL (ref 8.7–10.2)
Chloride: 102 mmol/L (ref 96–106)
Creatinine, Ser: 0.64 mg/dL (ref 0.57–1.00)
GFR calc Af Amer: 132 mL/min/{1.73_m2} (ref >59)
GFR calc non Af Amer: 114 mL/min/{1.73_m2} (ref >59)
Globulin, Total: 3.2 g/dL (ref 1.5–4.5)
Glucose: 105 mg/dL — ABNORMAL HIGH (ref 65–99)
Potassium: 4.4 mmol/L (ref 3.5–5.2)
Sodium: 139 mmol/L (ref 134–144)
Total Protein: 8 g/dL (ref 6.0–8.5)

## 2020-07-08 LAB — LIPID PANEL W/O CHOL/HDL RATIO
Cholesterol, Total: 226 mg/dL — ABNORMAL HIGH (ref 100–199)
HDL: 41 mg/dL (ref >39)
LDL Chol Calc (NIH): 155 mg/dL — ABNORMAL HIGH (ref 0–99)
Triglycerides: 166 mg/dL — ABNORMAL HIGH (ref 0–149)
VLDL Cholesterol Cal: 30 mg/dL (ref 5–40)

## 2020-07-08 LAB — TSH: TSH: 2.84 u[IU]/mL (ref 0.450–4.500)

## 2020-07-08 LAB — HGB A1C W/O EAG: Hgb A1c MFr Bld: 6 % — ABNORMAL HIGH (ref 4.8–5.6)

## 2020-11-06 ENCOUNTER — Other Ambulatory Visit: Payer: Self-pay | Admitting: Internal Medicine

## 2020-11-14 ENCOUNTER — Ambulatory Visit: Payer: Self-pay | Admitting: Internal Medicine

## 2021-02-22 ENCOUNTER — Other Ambulatory Visit: Payer: Self-pay

## 2021-02-26 ENCOUNTER — Other Ambulatory Visit: Payer: Self-pay | Admitting: Internal Medicine

## 2021-02-27 ENCOUNTER — Other Ambulatory Visit: Payer: Self-pay

## 2021-03-02 ENCOUNTER — Ambulatory Visit: Payer: Self-pay | Admitting: Internal Medicine

## 2021-03-08 ENCOUNTER — Other Ambulatory Visit: Payer: Self-pay

## 2021-03-08 DIAGNOSIS — R7303 Prediabetes: Secondary | ICD-10-CM

## 2021-03-08 DIAGNOSIS — R748 Abnormal levels of other serum enzymes: Secondary | ICD-10-CM

## 2021-03-08 DIAGNOSIS — E7800 Pure hypercholesterolemia, unspecified: Secondary | ICD-10-CM

## 2021-03-09 LAB — HEPATIC FUNCTION PANEL
ALT: 52 IU/L — ABNORMAL HIGH (ref 0–32)
AST: 21 IU/L (ref 0–40)
Albumin: 4.2 g/dL (ref 3.8–4.8)
Alkaline Phosphatase: 92 IU/L (ref 44–121)
Bilirubin Total: 0.3 mg/dL (ref 0.0–1.2)
Bilirubin, Direct: <0.1 mg/dL (ref 0.00–0.40)
Total Protein: 6.9 g/dL (ref 6.0–8.5)

## 2021-03-09 LAB — LIPID PANEL W/O CHOL/HDL RATIO
Cholesterol, Total: 180 mg/dL (ref 100–199)
HDL: 37 mg/dL — ABNORMAL LOW (ref >39)
LDL Chol Calc (NIH): 112 mg/dL — ABNORMAL HIGH (ref 0–99)
Triglycerides: 174 mg/dL — ABNORMAL HIGH (ref 0–149)
VLDL Cholesterol Cal: 31 mg/dL (ref 5–40)

## 2021-03-09 LAB — HEMOGLOBIN A1C
Est. average glucose Bld gHb Est-mCnc: 126 mg/dL
Hgb A1c MFr Bld: 6 % — ABNORMAL HIGH (ref 4.8–5.6)

## 2021-03-13 ENCOUNTER — Encounter: Payer: Self-pay | Admitting: Internal Medicine

## 2021-03-13 ENCOUNTER — Ambulatory Visit: Payer: Self-pay | Admitting: Internal Medicine

## 2021-03-13 ENCOUNTER — Other Ambulatory Visit: Payer: Self-pay

## 2021-03-13 VITALS — BP 146/90 | HR 72 | Resp 12 | Ht <= 58 in | Wt 207.0 lb

## 2021-03-13 DIAGNOSIS — I10 Essential (primary) hypertension: Secondary | ICD-10-CM

## 2021-03-13 DIAGNOSIS — Z23 Encounter for immunization: Secondary | ICD-10-CM

## 2021-03-13 DIAGNOSIS — R7303 Prediabetes: Secondary | ICD-10-CM

## 2021-03-13 DIAGNOSIS — Z6841 Body Mass Index (BMI) 40.0 and over, adult: Secondary | ICD-10-CM

## 2021-03-13 DIAGNOSIS — E782 Mixed hyperlipidemia: Secondary | ICD-10-CM

## 2021-03-13 DIAGNOSIS — K760 Fatty (change of) liver, not elsewhere classified: Secondary | ICD-10-CM

## 2021-03-13 NOTE — Progress Notes (Signed)
    Subjective:    Patient ID: Penny Spencer, female   DOB: 26-Jun-1985, 35 y.o.   MRN: [id number]   HPI  Tildon Husky interprets   Hyperlipidemia:  Total in January was 226, now down to 180.  LDL dropped from 155 to 112.  States she is making time to walk daily and has been eating a lot of avocados.  Has also decreased her soda intake.  Soda only once weekly.  Previously with daily meals.  Gets up at 8 a.m. Drinks tea with aloe vera or lemon Does not eat until noon:  Refried beans, tortillas, avocado, cheese, meat.  6-7 tortillas with each meal.  Water. 6 p.m.:  same meal basically as noon.  2.  Elevated transaminases: much improved with dietary changes.  Likely due to fatty liver.    3.  Prediabetes:  A1C was the same at 6.0%.  Diet as above.  Has actually gained 10 lbs.    No outpatient medications have been marked as taking for the 03/13/21 encounter (Office Visit) with Julieanne Manson, MD.   No Known Allergies   Review of Systems    Objective:   BP (!) 146/90 (BP Location: Right Arm, Patient Position: Sitting, Cuff Size: Normal)   Pulse 72   Resp 12   Ht 4\' 10"  (1.473 m)   Wt 207 lb (93.9 kg)   BMI 43.26 kg/m   Physical Exam NAD HEENT:  PERRL, EOMI Neck:  Supple, No adenopathy Chest:  CTA CV:  RRR without murmur or rub.  Radial and DP pulses normal and equal Abd:  S NT, No HSM or mass + BS LE:  No edema.   Assessment & Plan    Hyperlipidemia with fatty liver:  improved.  Discussed making more specific goals for dietary changes and physical activity  2.  Hypertension:  History only of PIH, but up now.  She promises to make goals as above to get BP down without medication.  Follow up in 2 months to see if meeting goals.  Encouraged starting medication if not.  3.  Prediabetes:  goals as above.

## 2021-03-13 NOTE — Patient Instructions (Signed)
Tome un vaso de agua antes de cada comida Tome un minimo de 6 a 8 vasos de agua diarios Coma tres veces al dia Coma una proteina y Neomia Dear grasa saludable con comida.  (huevos, pescado, pollo, pavo, y limite carnes rojas Coma 5 porciones diarias de legumbres.  Mezcle los colores Coma 2 porciones diarias de frutas con cascara cuando sea comestible Use platos pequeos Suelte su tenedor o cuchara despues de cada mordida hata que se mastique y se trague Come en la mesa con amigos o familiares por lo menos una vez al dia Apague la televisin y aparatos electrnicos durante la comida  Su objetivo debe ser perder una libra por semana  Estudios recientes indican que las personas quienes consumen todos de sus calorias durante 12 horas se bajan de pesocon Mas eficiencia.  Por ejemplo, si Usted come su primera comida a las 7:00 a.m., su comida final del dia se debe completar antes de las 7:00 p.m.  Recomendo solamente 2 tortillas pequenos al comida [place]) Recomendo no grasa con frijoles

## 2021-10-19 ENCOUNTER — Encounter: Payer: Self-pay | Admitting: Internal Medicine

## 2021-10-19 ENCOUNTER — Ambulatory Visit: Payer: Self-pay | Admitting: Internal Medicine

## 2021-10-19 VITALS — BP 138/86 | HR 84 | Resp 12 | Ht <= 58 in | Wt 202.0 lb

## 2021-10-19 DIAGNOSIS — R7303 Prediabetes: Secondary | ICD-10-CM

## 2021-10-19 DIAGNOSIS — K760 Fatty (change of) liver, not elsewhere classified: Secondary | ICD-10-CM

## 2021-10-19 DIAGNOSIS — E782 Mixed hyperlipidemia: Secondary | ICD-10-CM

## 2021-10-19 DIAGNOSIS — N898 Other specified noninflammatory disorders of vagina: Secondary | ICD-10-CM

## 2021-10-19 DIAGNOSIS — E010 Iodine-deficiency related diffuse (endemic) goiter: Secondary | ICD-10-CM

## 2021-10-19 DIAGNOSIS — Z23 Encounter for immunization: Secondary | ICD-10-CM

## 2021-10-19 DIAGNOSIS — Z0000 Encounter for general adult medical examination without abnormal findings: Secondary | ICD-10-CM

## 2021-10-19 LAB — POCT WET PREP WITH KOH
Clue Cells Wet Prep HPF POC: NEGATIVE
KOH Prep POC: NEGATIVE
RBC Wet Prep HPF POC: NEGATIVE
Trichomonas, UA: NEGATIVE
Yeast Wet Prep HPF POC: NEGATIVE

## 2021-10-19 NOTE — Patient Instructions (Signed)
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
## 2021-10-20 LAB — CBC WITH DIFFERENTIAL/PLATELET
Basophils Absolute: 0.1 10*3/uL (ref 0.0–0.2)
Basos: 1 %
EOS (ABSOLUTE): 0.2 10*3/uL (ref 0.0–0.4)
Eos: 2 %
Hematocrit: 43.9 % (ref 34.0–46.6)
Hemoglobin: 14.6 g/dL (ref 11.1–15.9)
Immature Grans (Abs): 0 10*3/uL (ref 0.0–0.1)
Immature Granulocytes: 0 %
Lymphocytes Absolute: 2.9 10*3/uL (ref 0.7–3.1)
Lymphs: 37 %
MCH: 28.8 pg (ref 26.6–33.0)
MCHC: 33.3 g/dL (ref 31.5–35.7)
MCV: 87 fL (ref 79–97)
Monocytes Absolute: 0.4 10*3/uL (ref 0.1–0.9)
Monocytes: 6 %
Neutrophils Absolute: 4.2 10*3/uL (ref 1.4–7.0)
Neutrophils: 54 %
Platelets: 336 10*3/uL (ref 150–450)
RBC: 5.07 x10E6/uL (ref 3.77–5.28)
RDW: 13.1 % (ref 11.7–15.4)
WBC: 7.8 10*3/uL (ref 3.4–10.8)

## 2021-10-20 LAB — COMPREHENSIVE METABOLIC PANEL
ALT: 81 IU/L — ABNORMAL HIGH (ref 0–32)
AST: 47 IU/L — ABNORMAL HIGH (ref 0–40)
Albumin/Globulin Ratio: 1.2 (ref 1.2–2.2)
Albumin: 4.1 g/dL (ref 3.8–4.8)
Alkaline Phosphatase: 88 IU/L (ref 44–121)
BUN/Creatinine Ratio: 18 (ref 9–23)
BUN: 10 mg/dL (ref 6–20)
Bilirubin Total: 0.3 mg/dL (ref 0.0–1.2)
CO2: 23 mmol/L (ref 20–29)
Calcium: 9.4 mg/dL (ref 8.7–10.2)
Chloride: 104 mmol/L (ref 96–106)
Creatinine, Ser: 0.56 mg/dL — ABNORMAL LOW (ref 0.57–1.00)
Globulin, Total: 3.5 g/dL (ref 1.5–4.5)
Glucose: 106 mg/dL — ABNORMAL HIGH (ref 70–99)
Potassium: 4.3 mmol/L (ref 3.5–5.2)
Sodium: 139 mmol/L (ref 134–144)
Total Protein: 7.6 g/dL (ref 6.0–8.5)
eGFR: 120 mL/min/{1.73_m2} (ref >59)

## 2021-10-20 LAB — LIPID PANEL W/O CHOL/HDL RATIO
Cholesterol, Total: 209 mg/dL — ABNORMAL HIGH (ref 100–199)
HDL: 35 mg/dL — ABNORMAL LOW (ref >39)
LDL Chol Calc (NIH): 138 mg/dL — ABNORMAL HIGH (ref 0–99)
Triglycerides: 201 mg/dL — ABNORMAL HIGH (ref 0–149)
VLDL Cholesterol Cal: 36 mg/dL (ref 5–40)

## 2021-10-20 LAB — TSH: TSH: 2.64 u[IU]/mL (ref 0.450–4.500)

## 2021-10-20 LAB — HGB A1C W/O EAG: Hgb A1c MFr Bld: 5.9 % — ABNORMAL HIGH (ref 4.8–5.6)

## 2022-04-15 ENCOUNTER — Other Ambulatory Visit: Payer: Self-pay

## 2022-04-22 ENCOUNTER — Ambulatory Visit: Payer: Self-pay | Admitting: Internal Medicine

## 2022-05-15 ENCOUNTER — Other Ambulatory Visit: Payer: Self-pay | Admitting: Internal Medicine

## 2022-05-15 ENCOUNTER — Telehealth: Payer: Self-pay | Admitting: Internal Medicine

## 2022-05-15 DIAGNOSIS — E010 Iodine-deficiency related diffuse (endemic) goiter: Secondary | ICD-10-CM | POA: Insufficient documentation

## 2022-05-15 NOTE — Progress Notes (Signed)
Document error.

## 2024-07-18 NOTE — Telephone Encounter (Signed)
 Closing note
